# Patient Record
Sex: Female | Born: 1970
Health system: Southern US, Community
[De-identification: ages and names within clinical notes are randomized; demographics above are authoritative.]

## PROBLEM LIST (undated history)

## (undated) DIAGNOSIS — M5020 Other cervical disc displacement, unspecified cervical region: Secondary | ICD-10-CM

## (undated) DIAGNOSIS — F909 Attention-deficit hyperactivity disorder, unspecified type: Secondary | ICD-10-CM

## (undated) DIAGNOSIS — F419 Anxiety disorder, unspecified: Secondary | ICD-10-CM

## (undated) DIAGNOSIS — F29 Unspecified psychosis not due to a substance or known physiological condition: Secondary | ICD-10-CM

## (undated) DIAGNOSIS — F329 Major depressive disorder, single episode, unspecified: Secondary | ICD-10-CM

## (undated) DIAGNOSIS — E049 Nontoxic goiter, unspecified: Secondary | ICD-10-CM

## (undated) DIAGNOSIS — F429 Obsessive-compulsive disorder, unspecified: Secondary | ICD-10-CM

## (undated) DIAGNOSIS — R4689 Other symptoms and signs involving appearance and behavior: Secondary | ICD-10-CM

## (undated) DIAGNOSIS — F4310 Post-traumatic stress disorder, unspecified: Secondary | ICD-10-CM

## (undated) HISTORY — PX: ABDOMINAL HYSTERECTOMY: SHX81

## (undated) HISTORY — DX: Anxiety disorder, unspecified: F41.9

## (undated) HISTORY — DX: Unspecified psychosis not due to a substance or known physiological condition: F29

## (undated) HISTORY — DX: Other cervical disc displacement, unspecified cervical region: M50.20

## (undated) HISTORY — PX: CRYOTHERAPY: SHX1416

## (undated) HISTORY — PX: TUBAL LIGATION: SHX77

## (undated) HISTORY — PX: WISDOM TOOTH EXTRACTION: SHX21

## (undated) HISTORY — DX: Major depressive disorder, single episode, unspecified: F32.9

## (undated) HISTORY — DX: Post-traumatic stress disorder, unspecified: F43.10

## (undated) HISTORY — DX: Attention-deficit hyperactivity disorder, unspecified type: F90.9

## (undated) HISTORY — DX: Obsessive-compulsive disorder, unspecified: F42.9

## (undated) HISTORY — PX: CHOLECYSTECTOMY: SHX55

## (undated) HISTORY — DX: Nontoxic goiter, unspecified: E04.9

## (undated) HISTORY — DX: Other symptoms and signs involving appearance and behavior: R46.89

---

## 1998-12-04 ENCOUNTER — Encounter (HOSPITAL_COMMUNITY): Admission: RE | Admit: 1998-12-04 | Discharge: 1999-03-04 | Payer: Self-pay | Admitting: Psychiatry

## 2000-04-24 ENCOUNTER — Other Ambulatory Visit: Admission: RE | Admit: 2000-04-24 | Discharge: 2000-04-24 | Payer: Self-pay | Admitting: Family Medicine

## 2000-04-29 ENCOUNTER — Ambulatory Visit (HOSPITAL_COMMUNITY): Admission: RE | Admit: 2000-04-29 | Discharge: 2000-04-29 | Payer: Self-pay | Admitting: Family Medicine

## 2000-04-29 ENCOUNTER — Encounter: Payer: Self-pay | Admitting: Family Medicine

## 2001-11-18 ENCOUNTER — Ambulatory Visit (HOSPITAL_COMMUNITY): Admission: RE | Admit: 2001-11-18 | Discharge: 2001-11-18 | Payer: Self-pay | Admitting: Obstetrics and Gynecology

## 2002-03-20 ENCOUNTER — Emergency Department (HOSPITAL_COMMUNITY): Admission: EM | Admit: 2002-03-20 | Discharge: 2002-03-20 | Payer: Self-pay | Admitting: Emergency Medicine

## 2003-03-09 ENCOUNTER — Inpatient Hospital Stay (HOSPITAL_COMMUNITY): Admission: EM | Admit: 2003-03-09 | Discharge: 2003-03-21 | Payer: Self-pay | Admitting: Psychiatry

## 2003-03-22 ENCOUNTER — Other Ambulatory Visit (HOSPITAL_COMMUNITY): Admission: RE | Admit: 2003-03-22 | Discharge: 2003-04-06 | Payer: Self-pay | Admitting: Psychiatry

## 2003-04-06 ENCOUNTER — Inpatient Hospital Stay (HOSPITAL_COMMUNITY): Admission: EM | Admit: 2003-04-06 | Discharge: 2003-04-17 | Payer: Self-pay | Admitting: *Deleted

## 2003-04-18 ENCOUNTER — Other Ambulatory Visit (HOSPITAL_COMMUNITY): Admission: RE | Admit: 2003-04-18 | Discharge: 2003-04-21 | Payer: Self-pay | Admitting: *Deleted

## 2003-05-13 ENCOUNTER — Inpatient Hospital Stay (HOSPITAL_COMMUNITY): Admission: EM | Admit: 2003-05-13 | Discharge: 2003-05-16 | Payer: Self-pay | Admitting: Psychiatry

## 2003-07-09 ENCOUNTER — Emergency Department (HOSPITAL_COMMUNITY): Admission: EM | Admit: 2003-07-09 | Discharge: 2003-07-10 | Payer: Self-pay | Admitting: Emergency Medicine

## 2004-04-11 ENCOUNTER — Emergency Department (HOSPITAL_COMMUNITY): Admission: EM | Admit: 2004-04-11 | Discharge: 2004-04-12 | Payer: Self-pay | Admitting: Emergency Medicine

## 2005-01-29 ENCOUNTER — Emergency Department (HOSPITAL_COMMUNITY): Admission: EM | Admit: 2005-01-29 | Discharge: 2005-01-29 | Payer: Self-pay | Admitting: Emergency Medicine

## 2005-01-29 ENCOUNTER — Inpatient Hospital Stay (HOSPITAL_COMMUNITY): Admission: EM | Admit: 2005-01-29 | Discharge: 2005-02-05 | Payer: Self-pay | Admitting: Psychiatry

## 2005-01-29 ENCOUNTER — Ambulatory Visit: Payer: Self-pay | Admitting: Psychiatry

## 2006-01-03 ENCOUNTER — Ambulatory Visit: Payer: Self-pay | Admitting: Family Medicine

## 2006-03-28 ENCOUNTER — Ambulatory Visit: Payer: Self-pay | Admitting: Internal Medicine

## 2006-05-14 ENCOUNTER — Ambulatory Visit: Payer: Self-pay | Admitting: Family Medicine

## 2006-05-23 ENCOUNTER — Ambulatory Visit: Payer: Self-pay | Admitting: Internal Medicine

## 2006-05-29 ENCOUNTER — Ambulatory Visit: Payer: Self-pay | Admitting: Family Medicine

## 2006-10-22 ENCOUNTER — Ambulatory Visit: Payer: Self-pay | Admitting: Family Medicine

## 2006-10-29 ENCOUNTER — Encounter (INDEPENDENT_AMBULATORY_CARE_PROVIDER_SITE_OTHER): Payer: Self-pay | Admitting: Specialist

## 2006-10-29 ENCOUNTER — Ambulatory Visit (HOSPITAL_COMMUNITY): Admission: RE | Admit: 2006-10-29 | Discharge: 2006-10-30 | Payer: Self-pay | Admitting: Obstetrics & Gynecology

## 2007-01-01 ENCOUNTER — Ambulatory Visit: Payer: Self-pay | Admitting: Family Medicine

## 2007-01-01 ENCOUNTER — Encounter: Admission: RE | Admit: 2007-01-01 | Discharge: 2007-01-01 | Payer: Self-pay | Admitting: Family Medicine

## 2007-01-13 ENCOUNTER — Emergency Department (HOSPITAL_COMMUNITY): Admission: EM | Admit: 2007-01-13 | Discharge: 2007-01-14 | Payer: Self-pay | Admitting: Emergency Medicine

## 2007-02-19 ENCOUNTER — Ambulatory Visit: Payer: Self-pay | Admitting: Family Medicine

## 2007-03-04 ENCOUNTER — Ambulatory Visit: Payer: Self-pay | Admitting: Family Medicine

## 2007-06-30 ENCOUNTER — Ambulatory Visit: Payer: Self-pay | Admitting: Family Medicine

## 2007-09-01 ENCOUNTER — Ambulatory Visit: Payer: Self-pay | Admitting: Family Medicine

## 2007-09-01 DIAGNOSIS — D649 Anemia, unspecified: Secondary | ICD-10-CM

## 2007-09-01 DIAGNOSIS — F329 Major depressive disorder, single episode, unspecified: Secondary | ICD-10-CM | POA: Insufficient documentation

## 2007-09-01 DIAGNOSIS — J45909 Unspecified asthma, uncomplicated: Secondary | ICD-10-CM | POA: Insufficient documentation

## 2007-09-01 DIAGNOSIS — E049 Nontoxic goiter, unspecified: Secondary | ICD-10-CM | POA: Insufficient documentation

## 2007-09-01 DIAGNOSIS — R55 Syncope and collapse: Secondary | ICD-10-CM | POA: Insufficient documentation

## 2007-09-01 HISTORY — DX: Unspecified asthma, uncomplicated: J45.909

## 2007-09-01 HISTORY — DX: Anemia, unspecified: D64.9

## 2007-09-01 HISTORY — DX: Major depressive disorder, single episode, unspecified: F32.9

## 2007-09-02 ENCOUNTER — Telehealth: Payer: Self-pay | Admitting: Family Medicine

## 2007-09-02 LAB — CONVERTED CEMR LAB
Albumin: 3.8 g/dL (ref 3.5–5.2)
Bilirubin, Direct: 0.1 mg/dL (ref 0.0–0.3)
Chloride: 108 meq/L (ref 96–112)
Creatinine, Ser: 0.9 mg/dL (ref 0.4–1.2)
Eosinophils Absolute: 0.1 10*3/uL (ref 0.0–0.6)
Eosinophils Relative: 2.1 % (ref 0.0–5.0)
GFR calc non Af Amer: 74 mL/min
Glucose, Bld: 95 mg/dL (ref 70–99)
Lymphocytes Relative: 37.8 % (ref 12.0–46.0)
MCHC: 34.6 g/dL (ref 30.0–36.0)
MCV: 88.8 fL (ref 78.0–100.0)
Monocytes Absolute: 0.3 10*3/uL (ref 0.2–0.7)
Monocytes Relative: 8.2 % (ref 3.0–11.0)
Neutro Abs: 1.8 10*3/uL (ref 1.4–7.7)
Neutrophils Relative %: 51.9 % (ref 43.0–77.0)
Potassium: 4.5 meq/L (ref 3.5–5.1)
RBC: 4.03 M/uL (ref 3.87–5.11)
RDW: 12.1 % (ref 11.5–14.6)
Sodium: 141 meq/L (ref 135–145)
Total Protein: 6.4 g/dL (ref 6.0–8.3)
WBC: 3.6 10*3/uL — ABNORMAL LOW (ref 4.5–10.5)

## 2007-09-04 ENCOUNTER — Encounter: Payer: Self-pay | Admitting: Family Medicine

## 2007-10-05 ENCOUNTER — Encounter: Admission: RE | Admit: 2007-10-05 | Discharge: 2007-10-05 | Payer: Self-pay | Admitting: Endocrinology

## 2007-10-06 ENCOUNTER — Ambulatory Visit: Payer: Self-pay | Admitting: Family Medicine

## 2007-10-06 DIAGNOSIS — N3000 Acute cystitis without hematuria: Secondary | ICD-10-CM | POA: Insufficient documentation

## 2007-10-06 HISTORY — DX: Acute cystitis without hematuria: N30.00

## 2007-10-06 LAB — CONVERTED CEMR LAB
Bilirubin Urine: NEGATIVE
Blood in Urine, dipstick: NEGATIVE
Protein, U semiquant: NEGATIVE
Specific Gravity, Urine: >=1.03
pH: 6

## 2007-12-29 ENCOUNTER — Ambulatory Visit: Payer: Self-pay | Admitting: Family Medicine

## 2007-12-29 DIAGNOSIS — M533 Sacrococcygeal disorders, not elsewhere classified: Secondary | ICD-10-CM | POA: Insufficient documentation

## 2008-01-04 ENCOUNTER — Emergency Department (HOSPITAL_COMMUNITY): Admission: EM | Admit: 2008-01-04 | Discharge: 2008-01-04 | Payer: Self-pay | Admitting: Family Medicine

## 2008-01-14 ENCOUNTER — Emergency Department (HOSPITAL_COMMUNITY): Admission: EM | Admit: 2008-01-14 | Discharge: 2008-01-14 | Payer: Self-pay | Admitting: Family Medicine

## 2008-02-24 ENCOUNTER — Ambulatory Visit: Payer: Self-pay | Admitting: Family Medicine

## 2008-07-06 ENCOUNTER — Ambulatory Visit: Payer: Self-pay | Admitting: Family Medicine

## 2008-07-07 ENCOUNTER — Encounter: Admission: RE | Admit: 2008-07-07 | Discharge: 2008-07-07 | Payer: Self-pay | Admitting: Family Medicine

## 2008-07-15 ENCOUNTER — Encounter: Admission: RE | Admit: 2008-07-15 | Discharge: 2008-08-31 | Payer: Self-pay | Admitting: Family Medicine

## 2008-07-15 ENCOUNTER — Encounter: Payer: Self-pay | Admitting: Family Medicine

## 2008-08-11 ENCOUNTER — Encounter: Payer: Self-pay | Admitting: Family Medicine

## 2008-08-21 ENCOUNTER — Ambulatory Visit: Payer: Self-pay | Admitting: Internal Medicine

## 2008-08-21 ENCOUNTER — Observation Stay (HOSPITAL_COMMUNITY): Admission: EM | Admit: 2008-08-21 | Discharge: 2008-08-22 | Payer: Self-pay | Admitting: Family Medicine

## 2008-08-21 ENCOUNTER — Encounter: Payer: Self-pay | Admitting: Internal Medicine

## 2008-08-21 DIAGNOSIS — E0590 Thyrotoxicosis, unspecified without thyrotoxic crisis or storm: Secondary | ICD-10-CM

## 2008-08-21 DIAGNOSIS — R079 Chest pain, unspecified: Secondary | ICD-10-CM | POA: Insufficient documentation

## 2008-08-21 DIAGNOSIS — R002 Palpitations: Secondary | ICD-10-CM

## 2008-08-21 HISTORY — DX: Thyrotoxicosis, unspecified without thyrotoxic crisis or storm: E05.90

## 2008-09-08 ENCOUNTER — Ambulatory Visit: Payer: Self-pay | Admitting: Family Medicine

## 2008-09-08 DIAGNOSIS — F411 Generalized anxiety disorder: Secondary | ICD-10-CM

## 2008-09-08 HISTORY — DX: Generalized anxiety disorder: F41.1

## 2008-09-17 ENCOUNTER — Ambulatory Visit: Payer: Self-pay | Admitting: Psychiatry

## 2008-09-17 ENCOUNTER — Emergency Department (HOSPITAL_COMMUNITY): Admission: EM | Admit: 2008-09-17 | Discharge: 2008-09-17 | Payer: Self-pay | Admitting: Emergency Medicine

## 2008-09-17 ENCOUNTER — Inpatient Hospital Stay (HOSPITAL_COMMUNITY): Admission: EM | Admit: 2008-09-17 | Discharge: 2008-09-30 | Payer: Self-pay | Admitting: Psychiatry

## 2008-10-04 ENCOUNTER — Inpatient Hospital Stay (HOSPITAL_COMMUNITY): Admission: RE | Admit: 2008-10-04 | Discharge: 2008-10-15 | Payer: Self-pay | Admitting: Psychiatry

## 2009-02-08 ENCOUNTER — Ambulatory Visit: Payer: Self-pay | Admitting: Family Medicine

## 2009-02-08 DIAGNOSIS — J019 Acute sinusitis, unspecified: Secondary | ICD-10-CM

## 2009-02-08 HISTORY — DX: Acute sinusitis, unspecified: J01.90

## 2009-04-26 ENCOUNTER — Ambulatory Visit: Payer: Self-pay | Admitting: Family Medicine

## 2009-11-22 ENCOUNTER — Encounter: Admission: RE | Admit: 2009-11-22 | Discharge: 2009-11-22 | Payer: Self-pay | Admitting: Endocrinology

## 2011-05-14 NOTE — Assessment & Plan Note (Signed)
[facility]                                 ON-CALL NOTE   [name], [name]                        MRN:          [id number]  DATE:08/31/2007                            DOB:          08-14-1971    REFERRING PHYSICIAN:  Tera Mater. Clent Ridges, MD   PHONE NUMBER:  [phone]  Patient calling because she is having side effects of new medications.  She thinks she might have passed out yesterday.  Advised her to hold her  medications, see Dr. Clent Ridges tomorrow for evaluation.     Jeffrey A. Tawanna Cooler, MD  Electronically Signed    JAT/MedQ  DD: 08/31/2007  DT: 08/31/2007  Job #: [id number]

## 2011-05-14 NOTE — H&P (Signed)
NAMEGENESSA, BEMAN NO.:  [id number]   MEDICAL RECORD NO.:  [id number]          PATIENT TYPE:  IPS   LOCATION:  0302                          FACILITY:  [facility]   PHYSICIAN:  Vic Ripper, P.A.-C.DATE OF BIRTH:  [birth year]   DATE OF ADMISSION:  09/17/2008  DATE OF DISCHARGE:                       PSYCHIATRIC ADMISSION ASSESSMENT   This is a voluntary admission to the services of Dr. Geoffery Lyons.   IDENTIFYING INFORMATION:  This is a 40 year old, married, white female.  She presented here at the [facility] reporting obsessive  thoughts about suicide.  She reports that she had been doing well for  several years on her meds.  All of a sudden this past week, she began to  worry more and became more depressed.  She started thinking about  overdosing on pills last night but stopped herself.  She could not  contract for safety and hence was admitted.  Ms.  Batte was last with [place]  back in 2006, January 31st to February 05, 2005.  She had several  admissions in 2004.  She has been under the care of the [facility] [place] since 2008.   SOCIAL HISTORY:  She has a GED.  She has been married once.  She has 4  sons, one 63, twins 36, and one 35.  She is not employed.   FAMILY HISTORY:  She reports her mother was diagnosed with depression,  but she, too, was probably bipolar.   ALCOHOL AND DRUG HISTORY:  She denies.   PRIMARY CARE [place]:  Dr. Clent Ridges.  He also follows her for her  hyperthyroidism.  Jeral Pinch follows her psychiatrically.   She is currently prescribed Prozac 60 mg p.o. q.a.m., Adderall 30 mg  p.o. q.a.m., Geodon 240 mg at h.s., Klonopin 0.5 mg p.o. b.i.d. p.r.n.   She is ALLERGIC TO ROCEPHIN.   POSITIVE PHYSICAL FINDINGS:  She is a well-developed, well-nourished  female, who appears her stated age of 47.  We did recheck her TSH; it is  pending.  She reports having had it checked a few weeks ago and it was  still within normal limits.   She has no other positive findings on  review of systems today.  Her vital signs show she is 69 inches tall,  weighs 160, her temperature is 97.1, her blood pressure is 112/78 to  110/72, pulse is 83, respirations are 20.  She is status post a  hysterectomy in 2007, a cholecystectomy in 1997, two C-sections 1989 and  1990, and her thyroid condition.   MENTAL STATUS EXAM:  Today, she is alert and oriented.  She is  appropriately groomed, dressed, and nourished.  Her speech is not  pressured.  Her mood is appropriate to the situation.  She initiates  conversation.  She reports being somewhat embarrassed that she had to be  readmitted.  Thought processes are clear, rational, and goal oriented.  She specifically requests generic meds if possible.  Judgment and  insight are insight.  Concentration and memory are intact.  Intelligence  is at least average.  She reports not being  as actively suicidal since  being admitted to the hospital.  She is bipolar NOS.   DIAGNOSES:   AXIS I:  Deferred.   AXIS II:  Deferred.   AXIS III:  Hyperthyroidism.   AXIS IV:  Moderate stressors.   AXIS V:  Thirty-eight.   She has a significant history in the past for side effects to a variety  of medications.  She feels very good about the Geodon.  She feels that  this has been the best drug for her.  She states that the Prozac has  helped her stop obsessing, and in conjunction with Dr. Dub Mikes it was  decided to start Anafranil to see if we could stop her obsessive  thoughts.   ESTIMATED LENGTH OF STAY:  A week.      Vic Ripper, P.A.-C.     MD/MEDQ  D:  09/18/2008  T:  09/18/2008  Job:  [id number]
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
## 2011-05-17 NOTE — H&P (Signed)
[name], [name] NO.:  [id number]   MEDICAL RECORD NO.:  [id number]          PATIENT TYPE:  AMB   LOCATION:  SDC                           FACILITY:  [facility]   PHYSICIAN:  Ilda Mori, M.D.   DATE OF BIRTH:  06-13-1971   DATE OF ADMISSION:  DATE OF DISCHARGE:                                HISTORY & PHYSICAL   ________________ is menometrorrhagia.   This is a 40 year old gravida 4, para 3-0-1-4 (twins) who presents with the  chief complaint of menometrorrhagia. The patient has had heavy severe  bleeding for many years. She was treated in 2002 with a cryoablation with  some relief. However, over the past year, her bleeding pattern has become  heavier and more disruptive. In addition, she complains of pre- and post-  menstrual spotting which interferes with her sexual activity. The patient  has failed a cryoablation. In addition, she cannot tolerate birth control  pills.   PAST MEDICAL HISTORY:  Reveals that she has had a vaginal delivery followed  by 2 cesarean sections. The first cesarean section was done for twins and  the second cesarean section was a repeat. The patient had her gallbladder  removed in 1997. The patient's medical history is negative.   DRUG ALLERGIES:  INCLUDE ERYTHROMYCIN AND ROCEPHIN.   She is on no medications at the present time.   FAMILY HISTORY:  Positive for diabetes in an uncle and grandfather. Negative  for breast, ovarian, uterine or colon cancer.   SOCIAL HISTORY:  She is married for 22 years, has 4 children.   PHYSICAL EXAMINATION:  She is 5 feet 9 inches tall, 242 pounds. Blood  pressure 118/60.  Her ears, nose, and throat are normal.  Her neck is supple without thyromegaly.  Skin was without rashes.  Lymphatics were negative.  Her heart was regular sinus rhythm without murmurs or gallops.  Her lungs were clear.  Her abdomen was soft without hepatosplenomegaly.  Her pelvic exam revealed normal external genitalia.  Her vagina and cervix  appeared normal. Uterus was anterior and normal size. Her adnexa were  without masses.  Her breasts were without masses as well.   PREOPERATIVE LABORATORY DATA:  Revealed a sonohysterogram that was performed  on October 23 which revealed a uterus that was 8.8 cm x 5 x 6 cm. No myomas  were noted. The endometrial thickness was 0.64. There were no polyps or  submucous myoma detected.   IMPRESSION:  This is a 40 year old female who was troubled with heavy and  prolonged periods which affect her quality of life and interfere with her  sexual activity. Options were discussed with the patient. The fact that she  failed cryoablation and cannot take hormones and the fact that there was no  source of the bleeding found on sonohysterogram. Decision was made to  proceed with vaginal hysterectomy. Because of her previous 2 cesarean  sections, a laparoscopy will be performed prior to hysterectomy to ensure  that there are no adhesions from the previous surgeries which would  interfere with vaginal hysterectomy, and hopefully, a vaginal hysterectomy  will  be performed at that time. The patient was informed as well the  possibility of having to do an abdominal hysterectomy if vaginal  hysterectomy cannot be performed.      Ilda Mori, M.D.  Electronically Signed     RK/MEDQ  D:  10/28/2006  T:  10/28/2006  Job:  [id number]
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
## 2011-05-17 NOTE — Discharge Summary (Signed)
   NAME:  [name], [name]                     ACCOUNT NO.:  [id number]   MEDICAL RECORD NO.:  [id number]                   PATIENT TYPE:  IPS   LOCATION:  0507                                 FACILITY:  BH   PHYSICIAN:  Jeanice Lim, M.D.              DATE OF BIRTH:  1971-12-24   DATE OF ADMISSION:  05/13/2003  DATE OF DISCHARGE:  05/16/2003                                 DISCHARGE SUMMARY   IDENTIFYING DATA:  This is a 40 year old separated Caucasian female followed  by Dr. Kathrynn Running as an outpatient, feeling actively suicidal.   DICTATION ENDED HERE.                                               Jeanice Lim, M.D.    JEM/MEDQ  D:  06/09/2003  T:  06/09/2003  Job:  [id number]

## 2011-05-17 NOTE — Op Note (Signed)
[facility]  Patient:    [name], [name] Visit Number: [id number] MRN: [id number]          Service Type: DSU Location: [facility] Attending Physician:  Osborn Coho Dictated by:   Caralyn Guile. Arlyce Dice, M.D. Proc. Date: 11/18/01 Admit Date:  11/18/2001                             Operative Report  PREOPERATIVE DIAGNOSIS:       Menorrhagia.  POSTOPERATIVE DIAGNOSIS:      Menorrhagia.  PROCEDURE:                    Endometrial cryoablation.  SURGEON:                      Richard D. Arlyce Dice, M.D.  ANESTHESIA:                   Paracervical block with IV sedation.  ESTIMATED BLOOD LOSS:         Minimal.  FINDINGS:                     Normal-sized, anterior uterus sounding to 10 cm.  INDICATIONS:                  This is a 40 year old gravida 2, para 2 status post tubal ligation who presented to the office six weeks ago with menorrhagia, passing clots and unable to work.  D&C was performed and the patients bleeding subsided.  The following month, she presented to the office again with severely heavy menstrual period that also required her to stay home from work.  The patient was unresponsive to nonsteroidal anti-inflammatory drugs.  She is unable to take hormones because of adverse reaction to hormone medication that she has had when she tried birth control previously.  Options were discussed with the patient including hormonal shots to see if she would respond more favorably to these, Lupron and hysterectomy.  The decision was made to proceed with cryoablation as the least invasive form of permanent relief for her persistent problem.  DESCRIPTION OF PROCEDURE:     The patient was taken to the operating room and IV sedation was administered.  The patient was placed in the dorsal lithotomy position.  The vulva and perineum were prepped and draped in a sterile fashion.  Examination under anesthesia revealed a normal-sized uterus in the anterior  position.  The anterior lip of the cervix was grasped with a single-tooth tenaculum.  A total of 20 cc of 1% lidocaine was infiltrated in the paracervical tissues.  The uterus was sounded to 10 cm.  A cryoprobe was then introduced into the right cornu.  A freeze went down to -96 degrees Centigrade for six minutes.  The probe was thawed and then repositioned in the left cornu.  Another six-minute freeze at -96 degrees Centigrade was then performed.  After thawing the probe, the probe was removed and the procedure was terminated.  The patient left the operating room in good condition. Dictated by:   Caralyn Guile Arlyce Dice, M.D. Attending Physician:  Osborn Coho DD:  11/18/01 TD:  11/18/01 Job: [id number]
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
## 2011-05-17 NOTE — Discharge Summary (Signed)
NAME:  [name], [name]                     ACCOUNT NO.:  [id number]   MEDICAL RECORD NO.:  [id number]                   PATIENT TYPE:  IPS   LOCATION:  0502                                 FACILITY:  [facility]   PHYSICIAN:  Jeanice Lim, M.D.              DATE OF BIRTH:  [birth year]   DATE OF ADMISSION:  04/06/2003  DATE OF DISCHARGE:  04/17/2003                                 DISCHARGE SUMMARY   IDENTIFYING DATA:  This is a 40 year old Caucasian female, voluntarily  admitted, married, referred by outpatient psychiatrist for increasing  suicidal ideation and thought agitation over the past 2 weeks, feeling not  safe to be alone, locked self in bathroom to be safe, describing some racing  thoughts, mood swings, and obsessive ruminating on suicidal ideation,  feeling that she may not be able to control these urges.   ADMISSION MEDICATIONS:  Wellbutrin SR 100 mg daily, Effexor XR 150 mg,  Ambien, Gabitril, Klonopin.  The patient had been on Paxil and felt suicidal  and jittery and anxious, not feeling that she had responded to Effexor.   ALLERGIES:  Possibly PAXIL, ROCEPHIN, ERYTHROMYCIN.   PHYSICAL EXAMINATION:  Jittery, anxious, otherwise within normal limits,  neurologically nonfocal.   ROUTINE ADMISSION LABS:  Essentially within normal limits. >   ADMISSION DIAGNOSES:   AXIS I:  1. Major depressive disorder, recurrent, severe with suicidal ideation.  2. Rule out bipolar disorder based on history.   AXIS II:  Deferred.   AXIS III:  None.   AXIS IV:  Moderate.  Stress related to refractory illness.   AXIS V:  25/60.   HOSPITAL COURSE:  The patient was admitted and ordered routine p.r.n.  medications, underwent further monitoring, and was encouraged to participate  in individual, group and milieu therapy.  The patient was resumed on  medications.  The patient was unable to contract for safety outside the  hospital and was obsessed with dying.  The patient felt that  Effexor had not  helped and maybe worsened things.  She was tapered off of Effexor, had  failed Intensive Outpatient treatment, sent back up for inpatient  stabilization due to suicidal thoughts.  The patient described severe  depression, severe anxiety, obsessing about killing self, awakening very  anxious.  Continued to have suicidal thoughts.  She was stabilized on  medications including lithium added to stabilize mood.  Suicidal thoughts  decreased.  She described at times feeling giddy and her mood remained  somewhat labile initially and then became more stable as lithium was  optimized.  Seroquel was also optimized, along with trazodone to restore  sleep.  The patient had a history of sleeping 2-3 hours a night for several  years.  The patient was no longer visualizing committing suicide and  gradually suicidal thoughts resolved.  The patient described feeling  depressed at times but had less severe mood swings and no dangerous  ideations.  CONDITION ON DISCHARGE:  Markedly improved.  Mood was more euthymic, affect  brighter, thought process goal directed.  Thought content negative for  dangerous ideation.  The patient still appeared anxious at times, depressed  at times, and was concerned about symptoms recurring when she left the  hospital; however she was stable at the time of discharge, motivated to  follow up with the aftercare plan and be compliant with medications, as well  as use her increased support system.   DISCHARGE MEDICATIONS:  1. Lithium carbonate 300 mg 2 q.h.s.  2. Eskalith CR 450 mg q.a.m.  3. Trazodone 100 mg q.h.s. p.r.n. insomnia.  4. Klonopin 0.5 q.a.m., q.3 p.m., and 2 q.h.s.  5. Wellbutrin XL 150 mg q.a.m.  6. Ambien 10 mg q.h.s.  7. Seroquel 200 mg 2 q.h.s. and Seroquel 25 mg q.a.m. and q.3 p.m.   DISCHARGE DIAGNOSES:   AXIS I:  1. Bipolar disorder, type 2, mixed.  2. Obsessive-compulsive features.  3. Anxiety disorder.   AXIS II:   Deferred.   AXIS III:  None.   AXIS IV:  Moderate.  Stress related to refractory illness.   AXIS V:  Global assessment of function on discharge 50-55.                                                 Jeanice Lim, M.D.    Lovie Macadamia  D:  05/02/2003  T:  05/02/2003  Job:  [id number]
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
## 2011-05-17 NOTE — Op Note (Signed)
NAMENORMALEE, SISTARE NO.:  [id number]   MEDICAL RECORD NO.:  [id number]          PATIENT TYPE:  AMB   LOCATION:  SDC                           FACILITY:  [facility]   PHYSICIAN:  Ilda Mori, M.D.   DATE OF BIRTH:  1971-08-01   DATE OF PROCEDURE:  10/29/2006  DATE OF DISCHARGE:                                 OPERATIVE REPORT   PREOPERATIVE DIAGNOSIS:  Menorrhagia.   POSTOPERATIVE DIAGNOSIS:  Menorrhagia.   PROCEDURE:  Laparoscopically assisted vaginal hysterectomy.   SURGEON:  Ilda Mori, M.D.   ASSISTANT:  Carrington Clamp, M.D.   ANESTHESIA:  General endotracheal.   ESTIMATED BLOOD LOSS:  100 mL.   FINDINGS:  The tubes, ovaries and uterus appeared normal.  On laparoscopy,  the uterus was top normal size.  There were no adhesions in the pelvis due  to the previous cesarean operation.   COMPLICATIONS:  None.   INDICATIONS:  This is a 40 year old gravida 4, para 3-0-1-4, (twins) who is  admitted to the hospital for a vaginal hysterectomy.  The patient has had  many years of heavy vaginal bleeding.  She underwent cryoablation of the  endometrial tissue in 2002 with some improvement, however, her bleeding  became heavy once again. She cannot tolerate oral contraceptives and  requested definitive surgery to cure her heavy and irregular periods.   DESCRIPTION OF PROCEDURE:  The patient was brought to the operating room and  placed in the supine position where general endotracheal anesthesia was  induced.  She was then placed in the modified dorsal supine position and the  abdomen, perineum and vagina were prepped and draped in a sterile fashion.  The bladder was catheterized.  A Veress needle was introduced into the  peritoneal cavity.  Pneumoperitoneum was created.  A 5 mm port was then  placed through the base of the umbilicus and accessory instruments placed  through the suprapubic stab wound.  The pelvis was viewed and no pelvic  adhesions were  seen. At this point, the procedure went to the vaginal area.  The stirrups were repositioned for vaginal surgery and a weighted speculum  was placed in the posterior vaginal vault.  The anterior lip of the cervix  was grasped with a single tooth tenaculum and the paracervical tissues were  infiltrated with 0.5% Marcaine and 1:200,000 epinephrine.  The cervix was  circumcised and the anterior cul-de-sac was dissected but not entered.  The  posterior cul-de-sac was then entered and extended. The uterosacral  ligaments was grasped with the gyrus clamp and cautery instrument and  cauterized and cut.  The dissection was carried to the base of the cardinal  ligaments.  Attention was then turned back to the anterior cul-de-sac which  was then entered sharply.  The rest of the cardinal ligaments and the  uterine ligaments were clamped with a gyrus instrument, cauterized, and then  cut. The uterus did not descend easily and the dissection was carried out  through the base of the broad ligament.  The uterus was then wedged in the  hopes that decreasing the volume might help  a little bit.  This did, indeed,  help.  Finally, the round ligament and utero-ovarian anastomosis was  visualized, clamped with the gyrus cautery, cauterized and cut.  The  specimen was removed.  The posterior cuff was then closed with a running  interlocking Vicryl suture.  The parietal peritoneum was not closed.  The  anterior vaginal cuff was then closed with interrupted figure-of-eight  sutures.  The surgeon then regloved.  A pneumoperitoneum was recreated and  the pedicles were viewed with the laparoscope and were dry.  The procedure  was then terminated and the patient left the operating room in good  condition.      Ilda Mori, M.D.  Electronically Signed     RK/MEDQ  D:  10/29/2006  T:  10/29/2006  Job:  [id number]
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
## 2011-09-30 LAB — POCT I-STAT, CHEM 8
BUN: 12
Calcium, Ion: 1.17
Chloride: 102
Creatinine, Ser: 0.8
Glucose, Bld: 93
HCT: 38
Hemoglobin: 12.9
Potassium: 3.6
Sodium: 138
TCO2: 25

## 2011-09-30 LAB — RAPID URINE DRUG SCREEN, HOSP PERFORMED
Amphetamines: NOT DETECTED
Barbiturates: NOT DETECTED
Benzodiazepines: POSITIVE — AB
Cocaine: NOT DETECTED
Opiates: NOT DETECTED
Tetrahydrocannabinol: NOT DETECTED

## 2011-09-30 LAB — POCT PREGNANCY, URINE: Preg Test, Ur: NEGATIVE

## 2011-10-23 ENCOUNTER — Encounter: Payer: Self-pay | Admitting: Family Medicine

## 2011-10-23 ENCOUNTER — Ambulatory Visit (INDEPENDENT_AMBULATORY_CARE_PROVIDER_SITE_OTHER): Payer: BC Managed Care – PPO | Admitting: Family Medicine

## 2011-10-23 VITALS — BP 124/80 | HR 106 | Temp 98.9°F | Wt 217.0 lb

## 2011-10-23 DIAGNOSIS — M545 Low back pain, unspecified: Secondary | ICD-10-CM

## 2011-10-23 MED ORDER — PREDNISONE (PAK) 10 MG PO TABS: ORAL_TABLET | ORAL | Status: DC

## 2011-10-23 MED ORDER — CYCLOBENZAPRINE HCL 10 MG PO TABS: 10.0000 mg | ORAL_TABLET | Freq: Three times a day (TID) | ORAL | Status: AC | PRN

## 2011-10-23 NOTE — Progress Notes (Signed)
  Subjective:    Patient ID: [name], female    DOB: 1971-09-06, 40 y.o.   MRN: [id number]  HPI Here for the onset of sharp lower back pains yesterday morning when she got out of bed. The day before she had been clearing brush from her yard after a tree fell over. The pain does not radiate down the legs, no numbness or wekaness. Using heat and Motrin.    Review of Systems  Constitutional: Negative.   Musculoskeletal: Positive for back pain.       Objective:   Physical Exam  Constitutional: She appears well-developed and well-nourished.  Musculoskeletal:       Tender on the lower back with spasm. ROM is good, SLR are negative           Assessment & Plan:  Try Prednisone and Flexeril. Recheck prn

## 2011-12-13 ENCOUNTER — Ambulatory Visit (INDEPENDENT_AMBULATORY_CARE_PROVIDER_SITE_OTHER): Payer: BC Managed Care – PPO | Admitting: Family Medicine

## 2011-12-13 DIAGNOSIS — Z23 Encounter for immunization: Secondary | ICD-10-CM

## 2012-09-24 ENCOUNTER — Other Ambulatory Visit (HOSPITAL_COMMUNITY): Payer: Self-pay | Admitting: Family Medicine

## 2012-09-24 DIAGNOSIS — Z1231 Encounter for screening mammogram for malignant neoplasm of breast: Secondary | ICD-10-CM

## 2012-10-05 ENCOUNTER — Ambulatory Visit (HOSPITAL_COMMUNITY)
Admission: RE | Admit: 2012-10-05 | Discharge: 2012-10-05 | Disposition: A | Discharge status: Home / Self Care | Payer: Medicare Other | Source: Ambulatory Visit | Attending: Family Medicine | Admitting: Family Medicine

## 2012-10-05 DIAGNOSIS — Z1231 Encounter for screening mammogram for malignant neoplasm of breast: Secondary | ICD-10-CM | POA: Insufficient documentation

## 2013-09-01 ENCOUNTER — Other Ambulatory Visit (HOSPITAL_COMMUNITY): Payer: Self-pay | Admitting: Family Medicine

## 2013-09-01 DIAGNOSIS — Z1231 Encounter for screening mammogram for malignant neoplasm of breast: Secondary | ICD-10-CM

## 2013-10-06 ENCOUNTER — Ambulatory Visit (HOSPITAL_COMMUNITY): Payer: Medicare Other

## 2013-10-07 ENCOUNTER — Ambulatory Visit (HOSPITAL_COMMUNITY): Payer: Medicare Other

## 2013-10-15 ENCOUNTER — Ambulatory Visit (HOSPITAL_COMMUNITY)
Admission: RE | Admit: 2013-10-15 | Discharge: 2013-10-15 | Disposition: A | Discharge status: Home / Self Care | Payer: Medicare Other | Source: Ambulatory Visit | Attending: Family Medicine | Admitting: Family Medicine

## 2013-10-15 DIAGNOSIS — Z1231 Encounter for screening mammogram for malignant neoplasm of breast: Secondary | ICD-10-CM | POA: Insufficient documentation

## 2014-11-21 DIAGNOSIS — G588 Other specified mononeuropathies: Secondary | ICD-10-CM

## 2014-11-21 HISTORY — DX: Other specified mononeuropathies: G58.8

## 2014-11-29 DIAGNOSIS — E041 Nontoxic single thyroid nodule: Secondary | ICD-10-CM | POA: Insufficient documentation

## 2014-11-29 HISTORY — DX: Nontoxic single thyroid nodule: E04.1

## 2015-01-10 DIAGNOSIS — M797 Fibromyalgia: Secondary | ICD-10-CM

## 2015-01-10 DIAGNOSIS — G8929 Other chronic pain: Secondary | ICD-10-CM | POA: Insufficient documentation

## 2015-01-10 HISTORY — DX: Fibromyalgia: M79.7

## 2015-09-11 DIAGNOSIS — M7552 Bursitis of left shoulder: Secondary | ICD-10-CM | POA: Insufficient documentation

## 2015-09-11 HISTORY — DX: Bursitis of left shoulder: M75.52

## 2016-01-09 DIAGNOSIS — M722 Plantar fascial fibromatosis: Secondary | ICD-10-CM | POA: Insufficient documentation

## 2016-01-09 HISTORY — DX: Plantar fascial fibromatosis: M72.2

## 2016-05-14 ENCOUNTER — Other Ambulatory Visit: Payer: Self-pay

## 2017-01-01 ENCOUNTER — Other Ambulatory Visit: Payer: Self-pay | Admitting: Physician Assistant

## 2017-01-01 DIAGNOSIS — Z1239 Encounter for other screening for malignant neoplasm of breast: Secondary | ICD-10-CM

## 2017-01-20 ENCOUNTER — Ambulatory Visit: Payer: Medicare Other

## 2017-01-29 ENCOUNTER — Ambulatory Visit: Payer: Self-pay

## 2017-02-24 ENCOUNTER — Ambulatory Visit: Payer: Self-pay

## 2017-03-03 ENCOUNTER — Ambulatory Visit
Admission: RE | Admit: 2017-03-03 | Discharge: 2017-03-03 | Disposition: A | Discharge status: Home / Self Care | Payer: Medicare PPO | Source: Ambulatory Visit | Attending: Physician Assistant | Admitting: Physician Assistant

## 2017-03-03 DIAGNOSIS — Z1239 Encounter for other screening for malignant neoplasm of breast: Secondary | ICD-10-CM

## 2017-04-29 DIAGNOSIS — M5020 Other cervical disc displacement, unspecified cervical region: Secondary | ICD-10-CM

## 2017-04-29 HISTORY — DX: Other cervical disc displacement, unspecified cervical region: M50.20

## 2017-05-07 DIAGNOSIS — I998 Other disorder of circulatory system: Secondary | ICD-10-CM | POA: Insufficient documentation

## 2017-05-07 DIAGNOSIS — M48062 Spinal stenosis, lumbar region with neurogenic claudication: Secondary | ICD-10-CM | POA: Insufficient documentation

## 2017-05-07 HISTORY — DX: Other disorder of circulatory system: I99.8

## 2017-05-07 HISTORY — DX: Spinal stenosis, lumbar region with neurogenic claudication: M48.062

## 2017-06-02 DIAGNOSIS — M5127 Other intervertebral disc displacement, lumbosacral region: Secondary | ICD-10-CM | POA: Insufficient documentation

## 2017-06-02 HISTORY — DX: Other intervertebral disc displacement, lumbosacral region: M51.27

## 2017-06-11 DIAGNOSIS — M62830 Muscle spasm of back: Secondary | ICD-10-CM | POA: Insufficient documentation

## 2017-08-05 DIAGNOSIS — M47816 Spondylosis without myelopathy or radiculopathy, lumbar region: Secondary | ICD-10-CM | POA: Insufficient documentation

## 2017-08-27 ENCOUNTER — Other Ambulatory Visit (HOSPITAL_COMMUNITY): Payer: Medicare PPO | Attending: Psychiatry | Admitting: Licensed Clinical Social Worker

## 2017-08-27 DIAGNOSIS — F332 Major depressive disorder, recurrent severe without psychotic features: Secondary | ICD-10-CM | POA: Diagnosis not present

## 2017-08-27 DIAGNOSIS — F411 Generalized anxiety disorder: Secondary | ICD-10-CM | POA: Diagnosis not present
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
## 2017-09-02 ENCOUNTER — Encounter (HOSPITAL_COMMUNITY): Payer: Self-pay | Admitting: Psychiatry

## 2017-09-02 ENCOUNTER — Other Ambulatory Visit (HOSPITAL_COMMUNITY): Payer: Medicare PPO | Admitting: Licensed Clinical Social Worker

## 2017-09-02 ENCOUNTER — Other Ambulatory Visit (HOSPITAL_COMMUNITY): Payer: Medicare PPO | Attending: Psychiatry | Admitting: Occupational Therapy

## 2017-09-02 DIAGNOSIS — M7989 Other specified soft tissue disorders: Secondary | ICD-10-CM | POA: Diagnosis not present

## 2017-09-02 DIAGNOSIS — F428 Other obsessive-compulsive disorder: Secondary | ICD-10-CM | POA: Insufficient documentation

## 2017-09-02 DIAGNOSIS — F411 Generalized anxiety disorder: Secondary | ICD-10-CM | POA: Insufficient documentation

## 2017-09-02 DIAGNOSIS — F331 Major depressive disorder, recurrent, moderate: Secondary | ICD-10-CM

## 2017-09-02 DIAGNOSIS — F4310 Post-traumatic stress disorder, unspecified: Secondary | ICD-10-CM | POA: Insufficient documentation

## 2017-09-02 DIAGNOSIS — F332 Major depressive disorder, recurrent severe without psychotic features: Secondary | ICD-10-CM | POA: Diagnosis not present

## 2017-09-02 DIAGNOSIS — F422 Mixed obsessional thoughts and acts: Secondary | ICD-10-CM

## 2017-09-02 MED ORDER — FLUOXETINE HCL 20 MG PO CAPS: 20.0000 mg | ORAL_CAPSULE | Freq: Every day | ORAL | 2 refills | Status: DC

## 2017-09-02 MED ORDER — ZIPRASIDONE HCL 60 MG PO CAPS: ORAL_CAPSULE | ORAL | 1 refills | Status: DC

## 2017-09-02 MED ORDER — TOPIRAMATE 100 MG PO TABS: 100.0000 mg | ORAL_TABLET | Freq: Two times a day (BID) | ORAL | 2 refills | Status: DC

## 2017-09-02 NOTE — Therapy (Addendum)
[place], [place], [address] Phone: [phone]   Fax:  [phone]  Occupational Therapy Evaluation & Treatment  Patient Details  Name: [name] MRN: [id number] Date of Birth: Apr 08, 1971 Referring Provider: Dr. Donnelly Angelica  Encounter Date: 09/02/2017      OT End of Session - 09/02/17 1249    Visit Number 1   Number of Visits 6   Date for OT Re-Evaluation 09/23/17   Authorization Type 1) [employer] Medicare 2) Generic Commercial   OT Start Time 1030   OT Stop Time 1130   OT Time Calculation (min) 60 min   Activity Tolerance Patient tolerated treatment well   Behavior During Therapy [facility] for tasks assessed/performed      Past Medical History:  Diagnosis Date  . ADHD (attention deficit hyperactivity disorder)   . Anxiety    ptsd  . Depression    with psychosis, bipolar, ect therapy  . Goiter    multinodualr  . OCD (obsessive compulsive disorder)   . Suicidal behavior     Past Surgical History:  Procedure Laterality Date  . ABDOMINAL HYSTERECTOMY    . CHOLECYSTECTOMY    . CRYOTHERAPY     cervix  . TUBAL LIGATION      There were no vitals filed for this visit.      Subjective Assessment - 09/02/17 1248    Currently in Pain? No/denies           [facility] OT Assessment - 09/02/17 1248      Assessment   Diagnosis Major Depressive Disorder   Referring Provider Dr. Donnelly Angelica   Onset Date --  Chronic     Precautions   Precautions None     Balance Screen   Has the patient fallen in the past 6 months No   Has the patient had a decrease in activity level because of a fear of falling?  No   Is the patient reluctant to leave their home because of a fear of falling?  No       OT assessment:  Diagnosis:  MDD Past medical history: GAD, OCD, multiple hospitalizations for MDD/SI Living situation:  Husband ADLs:  Independent Work:  Does not work Leisure: Education administrator,  couponing Social support: husband, 4 sons-close to children and 6 grandchildren; best friend Struggles:  depression, anxiety OT goal:  Scientist, research (medical) Causality Orientation Scale    Subscore Percentile Score  Autonomy  67  90.51  Control  41  20.11  Impersonal  50  58.92    Motivation Type  Motivation type Explanation   Autonomy-oriented The individual is clear about what he or she is doing.  There is clear connection between behavior and interest/personal goals.  Motivation is intact.  Assessment:  Patient demonstrates autonomy-oriented motivation type.  Pt has clear connection between behavior and interest/personal goals. Patient will benefit from occupational therapy intervention in order to improve time management, financial management, stress management, job readiness skills, social skills, sleep hygiene, exercise and healthy eating habits, and health management skills and other psychosocial skills needed for preparation to return to full time community living and to be a productive community member.     Plan:  Patient will participate in skilled occupational therapy sessions individually or in a group setting to improve coping skills, psychosocial skills, and emotional skills required to return to prior level of function as a productive community member. Treatment will be 1-2  times per week for 2-6 weeks.       OT Treatment Session: Time Management    S: "I need to deep clean to get rid of some anxiety."  O: Time management session completed with emphasis on skills required, effective versus ineffective time management, importance of structure, along with tips and strategies for success. Patient provided with education on the effects of time management in regards to improving physical, emotional, and social well-being including improved ability for focus, decision-making skills, success in school, work, and social commitments, as well as benefits of reduced stress  and the experience of greater success in all aspects of daily life.   A: Pt participated in time management occupational therapy treatment session this date within the [facility] program. Pt actively engaged in discussion on 10 tips for time management. Pt completed worksheets looking at her daily schedule, time wasters, and mine for time. Pt completed problem solving on how to incorporate time for herself as well as planning and working towards small goals versus large tasks that are harder to sustain. At end of session pt identified strategies for improving current time management including using a planner book, sticky notes, or google planner app on her phone.   P: Pt provided with time management strategies to implement during her daily schedule to assist in improving her balance between self-care, school, leisure, and PHP program tasks. OT will follow up with pt on success or struggles with implementation of learned strategies in 1 week.                  OT Short Term Goals - 2017/09/03 1251      OT SHORT TERM GOAL #1   Title Patient will be educated on strategies to improve psychosocial skills needed to participate fully in all daily, work, and leisure activities.   Time 3   Period Weeks   Status New   Target Date 09/23/17     OT SHORT TERM GOAL #2   Title Patient will be educated on a HEP and independent with implementation of HEP.   Time 3   Period Weeks   Status New     OT SHORT TERM GOAL #3   Title Patient will independently apply psychosocial skills and coping mechanisms to her daily activities in order to function independently.   Time 3   Period Weeks   Status New                  Plan - 03-Sep-2017 1250    Occupational performance deficits (Please refer to evaluation for details): ADL's;IADL's;Rest and Sleep;Social Participation;Leisure   Rehab Potential Good   OT Frequency 2x / week   OT Duration --  3 weeks   OT Treatment/Interventions Self-care/ADL  training;Other (comment)  coping skills training, psychosocial skills training, community reintegration   Clinical Decision Making Limited treatment options, no task modification necessary   Consulted and Agree with Plan of Care Patient      Patient will benefit from skilled therapeutic intervention in order to improve the following deficits and impairments:  Other (comment) (decreased coping skills; decreased psychosocial skills)  Visit Diagnosis: Severe episode of recurrent major depressive disorder, without psychotic features ([place])      2017-09-03 0706  OT G-codes  Functional Assessment Tool Used (Outpatient only) clinical judgement  Functional Limitation Self care  Self Care Current Status (T5573) CJ  Self Care Goal Status (U2025) CI    Problem List Patient Active Problem List   Diagnosis Date  Noted  . ACUTE SINUSITIS, UNSPECIFIED 02/08/2009  . ANXIETY DISORDER, GENERALIZED 09/08/2008  . HYPERTHYROIDISM 08/21/2008  . PALPITATIONS 08/21/2008  . CHEST PAIN 08/21/2008  . COCCYGEAL PAIN 12/29/2007  . CYSTITIS, ACUTE 10/06/2007  . GOITER NOS 09/01/2007  . ANEMIA-NOS 09/01/2007  . DEPRESSION 09/01/2007  . ASTHMA 09/01/2007  . SYNCOPE 09/01/2007   Guadelupe Sabin, OTR/L  [phone] 09/02/2017, 12:52 PM  [facility] PARTIAL HOSPITALIZATION PROGRAM [place], [place], [address] Phone: [phone]   Fax:  [phone]  Name: [name] MRN: [id number] Date of Birth: [birth year]

## 2017-09-02 NOTE — Psych (Signed)
Behavioral Health Partial Program Assessment Note  Date: 09/02/2017 Name: [name] MRN: [id number]  Chief Complaint: depression and anxiety after steroid injections for pain  Subjective:has return of depression, OCD and anxiety symptoms after injections for back pain    HPI:Ms Chargois says she had ECT for depression 11 years ago and did very well with depression, OCD and anxiety until she had steroid injections in her back for chronic disc disease.  She has always responded poorly to steroids including oral preparations.  This go round the back feels better but she has become depressed again, has increased anxiety and OCD had gotten worse.  She has always had the symptoms but could manage them.  Depression is sadness, decreased energy. Interest, decreased focus, trouble organizing thoughts, decreased appetite, poor sleep and tiredness.  The anxiety is general anxiety and a feeling people are talking about her or judging her.  The OCD is thoughts that she has done something bad such as running over some one with her car or just repetitive thoughts that she has done something bad and being unable to stop thinking the thoughts.  She feels "paranoid" in public that people are looking at her, hearing what she is saying and judging her even though she knows they are not really.  She feels guilty that she is going through all this again particularly towards her husband who has been so good to and for her and does not deserve to go through all this again.  No real stressors in her life.  Things are good with husband, finances, her children and grandchildren.   Patient is a 46 y.o. Caucasian female presents with depression and anxiety.  Patient was enrolled in partial psychiatric program on 09/02/17.  Primary complaints include: anxiety, concern about health problems, depression worse, feeling depressed, poor concentration and tearfulness.  Onset of symptoms was abrupt with gradually worsening course since  that time. Psychosocial Stressors include the following: no particular stressors.   I have reviewed the history as presented by the patient  Complaints of Pain: in her back and hips Past Psychiatric History:  Off and on therapy and med management over the years    Substance Abuse History: none Use of Alcohol: denied Use of Caffeine: other not an issue Use of over the counter: not an issue  Past Surgical History:  Procedure Laterality Date  . ABDOMINAL HYSTERECTOMY    . CHOLECYSTECTOMY    . CRYOTHERAPY     cervix  . TUBAL LIGATION      Past Medical History:  Diagnosis Date  . ADHD (attention deficit hyperactivity disorder)   . Anxiety    ptsd  . Depression    with psychosis, bipolar, ect therapy  . Goiter    multinodualr  . OCD (obsessive compulsive disorder)   . Suicidal behavior    Outpatient Encounter Prescriptions as of 09/02/2017  Medication Sig  . calcium carbonate (OS-CAL) 600 MG TABS Take 600 mg by mouth daily.    . cholecalciferol (VITAMIN D) 1000 UNITS tablet Take 1,000 Units by mouth daily.    . clonazePAM (KLONOPIN) 0.5 MG tablet Take 0.5 mg by mouth 2 (two) times daily as needed.    . fluconazole (DIFLUCAN) 150 MG tablet Take 150 mg by mouth once.    Marland Kitchen FLUoxetine (PROZAC) 20 MG capsule Take 1 capsule (20 mg total) by mouth daily.  Marland Kitchen FOLIC ACID PO Take by mouth daily. Take 2 mg   . HYDROcodone-homatropine (HYCODAN) 5-1.5 MG/5ML syrup Take 5  mLs by mouth every 6 (six) hours as needed.    . Multiple Vitamins-Minerals (MULTIVITAMIN & MINERAL PO) Take by mouth.    . predniSONE (STERAPRED UNI-PAK) 10 MG tablet As directed for 12 days  . topiramate (TOPAMAX) 100 MG tablet Take 1 tablet (100 mg total) by mouth 2 (two) times daily.  . ziprasidone (GEODON) 60 MG capsule Take one tablet daily at bedtime  . [DISCONTINUED] DULoxetine (CYMBALTA) 60 MG capsule Take 60 mg by mouth daily.    . [DISCONTINUED] traZODone (DESYREL) 100 MG tablet Take 100 mg by mouth at bedtime.     . [DISCONTINUED] ziprasidone (GEODON) 60 MG capsule Take 60 mg by mouth. 3 caps at bedtime    No facility-administered encounter medications on file as of 09/02/2017.    Allergies  Allergen Reactions  . Ceftriaxone Sodium     REACTION: unspecified  . Erythromycin Ethylsuccinate     REACTION: unspecified  . Lorazepam     REACTION: unspecified    Social History  Substance Use Topics  . Smoking status: Never Smoker  . Smokeless tobacco: Never Used  . Alcohol use No   Functioning Relationships: good support system, good relationship with spouse or significant other, good relationship with children and good relationship with parents Education: GED: took some college courses Other Pertinent History: None Family History  Problem Relation Age of Onset  . Alcohol abuse Other   . Arthritis Other   . Diabetes Other   . Schizophrenia Father      Review of Systems Constitutional: negative Eyes: negative Ears, nose, mouth, throat, and face: negative Respiratory: negative Cardiovascular: negative Gastrointestinal: negative Genitourinary: negative Integument/breast: negative Hematologic/lymphatic: negative Musculoskeletal: back and hip pain.  Walks slowly Neurological: back pain includes nerve involvement Behavioral/Psych: depression,anxiety and OCD symptoms Endocrine: negative Allergic/Immunologic: negative  Objective:  There were no vitals filed for this visit.  Physical Exam: No exam performed today, no exam necessary.  Mental Status Exam: Appearance:  Well groomed Psychomotor::  Within Normal Limits Attention span and concentration: Normal Behavior: calm, cooperative and adequate rapport can be established Speech:  normal pitch and normal volume Mood:  anxious Affect:  normal and mood-congruent Thought Process:  Coherent and Goal Directed Thought Content:  Logical Orientation:  person, place and time/date Cognition:  grossly intact Insight:  Intact Judgment:   Intact Estimate of Intelligence: Above average Fund of knowledge: Intact Memory: Recent and remote intact Abnormal movements: None Gait and station: Normal  Assessment:  Diagnosis:     Major depression, recurrent, moderate.  Generalized anxiety disorder.  Obsessive compulsive disorder.  History of PTSD    Indications for admission: inpatient care required if not in partial hospital program  Plan: patient enrolled in Partial Hospitalization Program  Treatment options and alternatives reviewed with patient and patient understands the above plan.   Comments: Ms Odeh has already discontinued duloxetine.  Will try fluoxetine which helped in the past at 20 mg.  Will increase ziprasidone to 60 mg which helped with the OCD perseverative thoughts in the past at larger doses.  Will restart topiramate which helped in the past with depression and moodiness.  Daily group therapy with skills training. Donnelly Angelica, MD

## 2017-09-03 ENCOUNTER — Other Ambulatory Visit (HOSPITAL_COMMUNITY): Payer: Medicare PPO | Admitting: Licensed Clinical Social Worker

## 2017-09-03 DIAGNOSIS — F331 Major depressive disorder, recurrent, moderate: Secondary | ICD-10-CM

## 2017-09-03 DIAGNOSIS — F332 Major depressive disorder, recurrent severe without psychotic features: Secondary | ICD-10-CM | POA: Diagnosis not present

## 2017-09-03 DIAGNOSIS — F411 Generalized anxiety disorder: Secondary | ICD-10-CM

## 2017-09-04 ENCOUNTER — Other Ambulatory Visit (HOSPITAL_COMMUNITY): Payer: Medicare PPO | Admitting: Licensed Clinical Social Worker

## 2017-09-04 ENCOUNTER — Other Ambulatory Visit (HOSPITAL_COMMUNITY): Payer: Medicare PPO | Admitting: Specialist

## 2017-09-04 DIAGNOSIS — F411 Generalized anxiety disorder: Secondary | ICD-10-CM

## 2017-09-04 DIAGNOSIS — F332 Major depressive disorder, recurrent severe without psychotic features: Secondary | ICD-10-CM | POA: Diagnosis not present

## 2017-09-04 DIAGNOSIS — F331 Major depressive disorder, recurrent, moderate: Secondary | ICD-10-CM

## 2017-09-04 NOTE — Psych (Signed)
Note opened in error.

## 2017-09-04 NOTE — Psych (Signed)
   [facility] BH PHP THERAPIST PROGRESS NOTE  [name] [id number]  Session Time: 9 - 2   Participation Level: Active  Behavioral Response: CasualAlertAnxious and Depressed  Type of Therapy: Group Therapy , Psychotherapy, Spiritual care, Activity therapy   Treatment Goals addressed: Coping  Interventions: CBT, DBT, Supportive and Reframing  Summary:  9:00 - 10:30 Clinician led check-in regarding current stressors and situation, and review of patient completed daily inventory. Clinician utilized active listening and empathetic response and validated patient emotions. Clinician facilitated processing group on pertinent issues.  10:30 -12:00 Spiritual care group  12:00 - 12:45 Reflection group: Patients encouraged to practice skills and interpersonal techniques or work on mindfulness and relaxation techniques. The importance of self-care and making skills part of a routine to increase usage were stressed.  12:45 - 1:50 Relaxation group: Cln [name] led yoga group focused on retraining the body's response to stress.  1:50 - 2:00 Clinician led check-out. Clinician assessed for immediate needs, medication compliance and efficacy, and safety concerns.    Suicidal/Homicidal: Nowithout intent/plan  Therapist Response: [name] is a 46 y/o female presenting with depression and anxiety symptoms. Patient arrived within time allowed and reports she is feeling "fair." Patient rates her mood at a 5 on a 1- 10 scale with 10 being great. Pt reports she was able to complete what she had planned yesterday and slept well. Pt shares that she is finding support from her pets. Patient engaged in activity and discussion. Patient demonstrates some progress as evidenced by stating ways she can utilize skills for her anxiety. Patient denies SI/HI/self-harm thoughts at end of group.   Plan: Pt will decrease depression and anxiety symptoms while increasing ability to manage symptoms.    Diagnosis: Depression, major, recurrent, moderate ([place]) [F33.1]    1. Depression, major, recurrent, moderate ([place])   2. Generalized anxiety disorder       [name], LCSW 09/04/2017

## 2017-09-04 NOTE — Therapy (Addendum)
[facility] [place], [place], [address] Phone: [phone]   Fax:  [phone]  Occupational Therapy Treatment  Patient Details  Name: [name] MRN: [id number] Date of Birth: 1971/11/24 Referring Provider: Dr. Donnelly Angelica  Encounter Date: 09/04/2017      OT End of Session - 09/04/17 2139    Visit Number 2   Number of Visits 6   Date for OT Re-Evaluation 09/23/17   Authorization Type 1) [employer] 2) Generic Commercial   OT Start Time 1030   OT Stop Time 1130   OT Time Calculation (min) 60 min      Past Medical History:  Diagnosis Date  . ADHD (attention deficit hyperactivity disorder)   . Anxiety    ptsd  . Depression    with psychosis, bipolar, ect therapy  . Goiter    multinodualr  . OCD (obsessive compulsive disorder)   . Suicidal behavior     Past Surgical History:  Procedure Laterality Date  . ABDOMINAL HYSTERECTOMY    . CHOLECYSTECTOMY    . CRYOTHERAPY     cervix  . TUBAL LIGATION      There were no vitals filed for this visit.      Subjective Assessment - 09/04/17 2138    Currently in Pain? No/denies      OT assessment Diagnosis MDD Past medical history see above Living situation with husband , family supportive ADLs independent Work n/a Leisure walking  Social support family Struggles coping OT goal learn coping skills  General Causality Orientation Scale   Subscore Percentile Score  Autonomy 61 74.50  Control 29 2.97  Impersonal 48 53.26   Motivation Type  Motivation type Explanation  o Autonomy-oriented The individual is clear about what he or she is doing.  There is clear connection between behavior and interest/personal goals.  Motivation is intact.  o    o    o    Assessment:  Patient demonstrates autonomy oriented motivation type.  Patient will benefit from occupational therapy intervention in order to improve time management, financial  management, stress management, job readiness skills, social skills,sleep hygiene, exercise and healthy eating habits,  and health management skills and other psychosocial skills needed for preparation to return to full time community living and to be a productive community member.   Plan:  Patient will participate in skilled occupational therapy sessions individually or in a group setting to improve coping skills, psychosocial skills, and emotional skills required to return to prior level of function as a productive community member. Treatment will be 1-2 times per week for 2-6 weeks.          S:  Work is stressful.  I think I want to try the 5, 4, 3, 2, 1. O:  Patient participated in skilled OT life skills group focusing on stress management.  Patient was educated on and discussed causes of stress, healthy vs unhealthy stress, behaviors, symptoms, feelings associated with stress.  Patient was educated on relaxation techniques and emotional regulation techniques to cope with stress, as well as calming activities associated with each of their 5 senses.   A:  Patient was engaged throughout group. Paitent able to identify new coping strategies for stress, such as fact checking and breathing.   P:  continue participation in skilled OT group 2 times per week.  Next group will focus on healthy sleep hygiene.  OT Education - 09/04/17 2139    Education provided Yes   Education Details stress coping skills, relaxation, emotional regulation, 5 senses use of coping   Person(s) Educated Patient   Methods Explanation;Handout   Comprehension Verbalized understanding          OT Short Term Goals - 09/04/17 2140      OT SHORT TERM GOAL #1   Title Patient will be educated on strategies to improve psychosocial skills needed to participate fully in all daily, work, and leisure activities.   Time 3   Period Weeks   Status On-going     OT SHORT TERM GOAL #2    Title Patient will be educated on a HEP and independent with implementation of HEP.   Time 3   Period Weeks   Status On-going     OT SHORT TERM GOAL #3   Title Patient will independently apply psychosocial skills and coping mechanisms to her daily activities in order to function independently.   Time 3   Period Weeks   Status On-going                Patient will benefit from skilled therapeutic intervention in order to improve the following deficits and impairments:   (decreased coping skills, decreased psychosocial skills)  Visit Diagnosis: Depression, major, recurrent, moderate ([place])    Problem List Patient Active Problem List   Diagnosis Date Noted  . ACUTE SINUSITIS, UNSPECIFIED 02/08/2009  . ANXIETY DISORDER, GENERALIZED 09/08/2008  . HYPERTHYROIDISM 08/21/2008  . PALPITATIONS 08/21/2008  . CHEST PAIN 08/21/2008  . COCCYGEAL PAIN 12/29/2007  . CYSTITIS, ACUTE 10/06/2007  . GOITER NOS 09/01/2007  . ANEMIA-NOS 09/01/2007  . DEPRESSION 09/01/2007  . ASTHMA 09/01/2007  . SYNCOPE 09/01/2007    Vangie Bicker, Tony, OTR/L [phone]  09/04/2017, 9:42 PM  [facility] PARTIAL HOSPITALIZATION PROGRAM [place], [place], [address] Phone: [phone]   Fax:  [phone]  Name: [name] MRN: [id number] Date of Birth: August 04, 1971

## 2017-09-04 NOTE — Psych (Signed)
   [facility] BH PHP THERAPIST PROGRESS NOTE  SOPHEAP BASIC [id number]  Session Time: 9-2  Participation Level: Active  Behavioral Response: CasualAlertAnxious and Depressed  Type of Therapy: Group Therapy, Psychotherapy, Psychoeducation, Occupational Therapy  Treatment Goals addressed: Coping  Interventions: CBT, DBT, Supportive and Reframing  Summary: 9:00 - 10:30 Clinician led check-in regarding current stressors and situation, and review of patient completed daily inventory. Clinician utilized active listening and empathetic response and validated patient emotions. Clinician facilitated processing group on pertinent issues.  10:30 -11:30: OT Group  11:30-12:15: Clinician led psychoeducation group on distress tolerance. Self Soothe skill was introduced and patients discussed how to utilize it. Patients identified when this technique may be helpful in their personal lives.  12:15 - 1:00: Reflection group: Patients encouraged to practice skills and interpersonal techniques or work on mindfulness and relaxation techniques. The importance of self-care and making skills part of a routine to increase usage were stressed.  1:00 - 1:50 Clinician continued topic of "Distress Tolerance". Group discussed "ACCEPTS" and how/when patients can employ this method to help. Patients identified when this technique may be helpful in their personal lives.  1:50 - 2:00 Clinician led check-out. Clinician assessed for immediate needs, medication compliance and efficacy, and safety concerns.    Suicidal/Homicidal: Nowithout intent/plan  Therapist Response: BETUL BRISKY is a 46 y.o. female who presents with depression and anxiety symptoms. Patient arrived within time allowed and reports she is feeling "anxious." Patient rates her mood at a 3 on a 1- 10 scale with 10 being great. Pt reports she had a good weekend, but did not do much.  Pt reports she was able to drive to have lunch with her husband, which has been  difficult for her lately.  Pt shared her troubles with steroid injections and feeling as if the doctors did not pay attention to her when she told them she does not respond well to steroids.  Pt discussed that she felt like she was being assertive for once, and feels it failed.  Pt reports she needs to work on being assertive, instead of passive, in more areas of her life.  Patient engaged in activity and discussion. Pt responded well to self-soothe and states she will start to use some of the activities to help her.  Patient demonstrates some progress as evidenced by actively participating in group on her first day. Patient denies SI/HI/self-harm thoughts at end of session.  Plan: Patient will continue in PHP and medication management while working on decreasing depression symptoms and increasing distress tolerance skills.  Diagnosis: Depression, major, recurrent, moderate ([place]) [F33.1]    1. Depression, major, recurrent, moderate ([place])   2. Generalized anxiety disorder   3. Mixed obsessional thoughts and acts     [employer], LPCA 09/04/2017

## 2017-09-05 ENCOUNTER — Encounter (HOSPITAL_COMMUNITY): Payer: Self-pay

## 2017-09-05 ENCOUNTER — Other Ambulatory Visit (HOSPITAL_COMMUNITY): Payer: Medicare PPO | Admitting: Licensed Clinical Social Worker

## 2017-09-05 VITALS — BP 108/70 | HR 82 | Ht 68.0 in | Wt 248.0 lb

## 2017-09-05 DIAGNOSIS — F900 Attention-deficit hyperactivity disorder, predominantly inattentive type: Secondary | ICD-10-CM

## 2017-09-05 DIAGNOSIS — F332 Major depressive disorder, recurrent severe without psychotic features: Secondary | ICD-10-CM | POA: Diagnosis not present

## 2017-09-05 DIAGNOSIS — F988 Other specified behavioral and emotional disorders with onset usually occurring in childhood and adolescence: Secondary | ICD-10-CM | POA: Insufficient documentation

## 2017-09-05 DIAGNOSIS — F411 Generalized anxiety disorder: Secondary | ICD-10-CM

## 2017-09-05 HISTORY — DX: Attention-deficit hyperactivity disorder, predominantly inattentive type: F90.0

## 2017-09-05 MED ORDER — FLUOXETINE HCL 10 MG PO CAPS: ORAL_CAPSULE | ORAL | 0 refills | Status: DC

## 2017-09-05 MED ORDER — AMPHETAMINE-DEXTROAMPHETAMINE 30 MG PO TABS: 30.0000 mg | ORAL_TABLET | Freq: Every day | ORAL | 0 refills | Status: DC

## 2017-09-05 NOTE — Psych (Signed)
   [facility] BH PHP THERAPIST PROGRESS NOTE  [name] [id number]  Session Time: 9 - 2   Participation Level: Active  Behavioral Response: CasualAlertAnxious and Depressed  Type of Therapy: Group Therapy , Psychotherapy, Psychoeducation, Occupational Therapy  Treatment Goals addressed: Coping  Interventions: CBT, DBT, Supportive and Reframing  Summary:  9:00 - 10:30 Clinician led check-in regarding current stressors and situation, and review of patient completed daily inventory. Clinician utilized active listening and empathetic response and validated patient emotions. Clinician facilitated processing group on pertinent issues.  10:30 -11:30: OT Group  11:30-12:00: Clinician led group discussion on fear.  12:00 - 12:45 Reflection group: Patients encouraged to practice skills and interpersonal techniques or work on mindfulness and relaxation techniques. The importance of self-care and making skills part of a routine to increase usage were stressed.  12:45 - 1:50 Clinician introduced topic of "Cognitive Distortions". Patients identified cognitive distortions they often have and ways to combat these distortions.  1:50 - 2:00 Clinician led check-out. Clinician assessed for immediate needs, medication compliance and efficacy, and safety concerns.    Suicidal/Homicidal: Nowithout intent/plan  Therapist Response: [name] "[name]" Westerman is a 46 y/o female presenting with depression and anxiety symptoms. Patient arrived within time allowed and reports she is feeling "anxious". Patient rates her mood at a 4 on a 1-10 scale with 10 being great. Patient reports she had a stressful night after receiving a message from her doctor about confirming an appointment to get more shots. Patient stated she cried a lot but managed her emotions by going on a walk with her best friend. Patient engaged in activity and discussion. Patient demonstrates some progress as evidenced by stating ways she has and will  continue to utilize skills for her anxiety and depression. Patient denies SI/HI/self-harm thoughts at the end of group.   Plan: Pt will decrease depression and anxiety symptoms while increasing ability to manage symptoms.   Diagnosis: Severe episode of recurrent major depressive disorder, without psychotic features ([place]) [F33.2]    1. Severe episode of recurrent major depressive disorder, without psychotic features ([place])   2. Generalized anxiety disorder    Royetta Crochet, LPCA 09/05/2017

## 2017-09-05 NOTE — Progress Notes (Signed)
Progress note      Restarted Adderall 30 mg daily as she clearly needs it to focus and organize.  The 20 mg of fluoxetine activates her so will drop dose to 10 mg for a week and then resume 20 mg.

## 2017-09-05 NOTE — Psych (Signed)
   [facility] BH PHP THERAPIST PROGRESS NOTE  [name] [id number]  Session Time: 9 - 1   Participation Level: Active  Behavioral Response: CasualAlertAnxious and Depressed  Type of Therapy: Group Therapy , Psychotherapy, Psychoeducation  Treatment Goals addressed: Coping  Interventions: CBT, DBT, Supportive and Reframing  Summary:  9:00 - 10:00 Clinician led check-in regarding current stressors and situation, and review of patient completed daily inventory. Clinician utilized active listening and empathetic response and validated patient emotions. Clinician facilitated processing group on pertinent issues.  10:00 -11:00: Clinician continued discussion on cognitive distortions from yesterday. Cln educated ways to reframe unhealthy thought patterns utilizing skills of check the facts, and the best friend test.  11:00 - 12:00: Clinician led psychoeducation group on emotion, logic, and wise mind and dialectical thinking. Clinician utilized pt examples to make relevant to pt's lives.  12:00 - 12:50 Clinician introduced topic of fear related to goals. Group watched "Why You Should Define Your Fears Instead of Your Goals" Ted-Talk. Patients discussed fear setting and how to utilize it as a skill.  12:50 - 1:00 Clinician led check-out. Clinician assessed for immediate needs, medication compliance and efficacy, and safety concerns.     Suicidal/Homicidal: Nowithout intent/plan  Therapist Response: [name] "[name]" Lavey is a 46 y/o female presenting with depression and anxiety symptoms. Patient arrived within time allowed and reports she is feeling "very anxious". Patient rates her mood at a 2 on a scale of 1-10 wit 10 being great. Patient reports she cried for four hours again last night and did not get a lot of sleep. Patient also stated that she has poor concentration and energy levels. Patient engaged in activity and discussion. Patient demonstrates some progress as evidenced by taking notes  and stating ways she can utilize skills for her anxiety. Patient also shared she likes receiving handouts on the skills so that she can refer back to them when she needs to. Patient denies SI/HI/ and self-harm thoughts at the end of group   Plan: Pt will decrease depression and anxiety symptoms while increasing ability to manage symptoms.   Diagnosis: Severe episode of recurrent major depressive disorder, without psychotic features ([place]) [F33.2]    1. Severe episode of recurrent major depressive disorder, without psychotic features ([place])   2. ADHD, predominantly inattentive type   3. Generalized anxiety disorder    [name], LCSW 09/05/2017

## 2017-09-05 NOTE — Addendum Note (Signed)
Addended by: Vangie Bicker on: 09/05/2017 03:23 PM   Modules accepted: Orders

## 2017-09-05 NOTE — Progress Notes (Signed)
Met with patient as she presented with flat affect, depressed and anxious mood and admitted she was not having a good day this date.  Patient continuously shook her legs during time together but admitted she is often anxious as her norm and denied any current suicidal or homicidal ideations with no history of auditory or visual hallucinations.  Patient discussed her history of always being somewhat anxious from elementary to high school and most of her life and then becoming so depressed she had ECT approximately 10 years previously with suicidal thoughts prior to treatments.  Patient states she did better after ECT treatments but then developed focus problems so used Adderall 30 mg twice a day at once and then went back down to 30 mg once a day because she was having sleep difficulty at night with twice a day.  Patient will restart Adderall 30 mg, one a day per Dr. Tanna Furry restart order this date and patients report of extreme focus problems currently. Patient has cut back on Prozac to 10 mg per Dr. Tanna Furry request as well and states inability to even focus on grading her depression at this time but reported she was having no suicidal or homicidal ideations but that her anxiety "is a 12" on a scale of 0-10 with 0 being none and 10 the worst.  Patient stated PHP was helpful and using distracting and other skills learned to help calm self with increased anxiety.  Patient reported ongoing daily chronic pain for two herniated discs and the start of this bought with increased depression and anxiety following procedure for a spinal infusion that her provider gave her more prednisone than should have been administered due to history.  Patient reports she has a "good family' with stable and supportive members.  Patient denies any thoughts of wanting to harm self or others with no plan or intent at this time and states topamax has helped some with back pain recently. Patient to start Adderall and to keep this nurse  informed if any side effects to medication or other symptoms.  Patient reported plan to remain in PHP and to continue until is discharged due to feeling groups and medication management helping.

## 2017-09-05 NOTE — Psych (Signed)
Individual 9:00-9:45  Pt presented this morning as tearful and depressed. Pt states she is struggling because she can't concentrate on anything and it is become worse and more frustrating. Pt reports she has cried for 4 hours for the pervious 2 evenings due to frustration and sadness. Pt had trouble staying on one topic, making it necessary for the clinician to ask for clarification numerous times. Pt states this depression feels different than previous depressions, though she admits her memory about the previous depressions is "foggy". Pt reports she feels like the Prozac is making her jittery. This was discussed with the doctor and her dose was halved. Pt reports she does not want to hurt herself because "I have too much to live for." Pt reports no acts of self-harm or HI, either.  Navdeep Halt, LPCA

## 2017-09-08 ENCOUNTER — Other Ambulatory Visit (HOSPITAL_COMMUNITY): Payer: Medicare PPO | Admitting: Licensed Clinical Social Worker

## 2017-09-08 DIAGNOSIS — F332 Major depressive disorder, recurrent severe without psychotic features: Secondary | ICD-10-CM | POA: Diagnosis not present

## 2017-09-08 NOTE — Progress Notes (Signed)
Progress note     [name] complains of stammering which sounds like dystonia when she describes it as trouble talking as if her throat muscles are tight.  She has trouble getting to sleep since starting Adderral 30 mg daily but did sleep okay last night.  She is having trouble remembering and trying to keep her thoughts organized to talk smoothly.  She wonders if she should have just had another round of ECT since that worked so well last time.        Plan:  Decrease topirimate to 50 mg bid to help with thinking process and memory               Decrease ziprasidone to 40 mg hs to eliminate the apparent dystonia.  Neither one of [place] want to add another med to deal with side effects                Add trazodone 50 to 100 mg hs for sleep               Continue Adderall 30 mg daily

## 2017-09-09 ENCOUNTER — Other Ambulatory Visit (HOSPITAL_COMMUNITY): Payer: Medicare PPO | Admitting: Licensed Clinical Social Worker

## 2017-09-09 ENCOUNTER — Other Ambulatory Visit (HOSPITAL_COMMUNITY): Payer: Medicare PPO | Admitting: Occupational Therapy

## 2017-09-09 DIAGNOSIS — F332 Major depressive disorder, recurrent severe without psychotic features: Secondary | ICD-10-CM | POA: Diagnosis not present
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
## 2017-09-09 NOTE — Therapy (Signed)
[address] [place], [place], [address] Phone: [phone]   Fax:  [phone]  Occupational Therapy Treatment  Patient Details  Name: [name] MRN: [id number] Date of Birth: January 25, 1971 Referring Provider: Dr. Donnelly Angelica  Encounter Date: 09/09/2017      OT End of Session - 09/09/17 1205    Visit Number 3   Number of Visits 6   Date for OT Re-Evaluation 09/23/17   Authorization Type 1) [employer] 2) Generic Commercial   OT Start Time 1030   OT Stop Time 1110   OT Time Calculation (min) 40 min      Past Medical History:  Diagnosis Date  . ADHD (attention deficit hyperactivity disorder)   . Anxiety    ptsd  . Depression    with psychosis, bipolar, ect therapy  . Goiter    multinodualr  . Herniated disc, cervical 04/2017   Has 2 herniated discs  . OCD (obsessive compulsive disorder)   . Suicidal behavior     Past Surgical History:  Procedure Laterality Date  . ABDOMINAL HYSTERECTOMY    . CHOLECYSTECTOMY    . CRYOTHERAPY     cervix  . TUBAL LIGATION      There were no vitals filed for this visit.      Subjective Assessment - 09/09/17 1205    Currently in Pain? No/denies            [facility] OT Assessment - 09/09/17 1205      Assessment   Diagnosis Major Depressive Disorder     Precautions   Precautions None         OT Treatment Group: Sleep Hygiene   S:  "I have trouble falling asleep and staying asleep."  O:  Patient actively participated in the following skilled occupational therapy treatment session this date: Sleep hygiene - Began session with sleep hygiene questionnaire. Pt completed questionnaire and participated in discussion. Group then played Sleep Hygiene Taboo game, pt actively participated and engaged in game. Discussed importance of a routine leading up to bedtime, and educated on factors influencing sleep. Patient marginally engaged during session. Pt  participated in group discussion of factors that interfere with sleep and lifestyle factors promoting sleep. Discussed importance of exercise for the body's metabolism and promoting healthy sleep, as well as foods that can help or hinder sleep.  A:  Patient participated in skilled occupational therapy group for sleep hygiene skills this date.  Patient was marginally engaged and appears open to strategies introduced; was emotional at times during session. Provided pt with sleep prep routine outline to complete.  P:  Continue participation in skilled occupational therapy groups  1-2 times per week in order to gain the necessary skills needed to return to full time community living and learn effective coping strategies to be a productive community resident. Next session: physical and mental health and wellness.             OT Short Term Goals - 09/04/17 2140      OT SHORT TERM GOAL #1   Title Patient will be educated on strategies to improve psychosocial skills needed to participate fully in all daily, work, and leisure activities.   Time 3   Period Weeks   Status On-going     OT SHORT TERM GOAL #2   Title Patient will be educated on a HEP and independent with implementation of HEP.   Time 3   Period Weeks  Status On-going     OT SHORT TERM GOAL #3   Title Patient will independently apply psychosocial skills and coping mechanisms to her daily activities in order to function independently.   Time 3   Period Weeks   Status On-going                  Plan - 09/09/17 1205    Rehab Potential Good   OT Frequency 2x / week   OT Duration --  3 weeks   OT Treatment/Interventions Self-care/ADL training;Other (comment)  coping skills training, psychosocial skills training, community reintegration   Consulted and Agree with Plan of Care Patient      Patient will benefit from skilled therapeutic intervention in order to improve the following deficits and impairments:  Other  (comment) (decreased coping skills; decreased psychosocial skills)  Visit Diagnosis: Severe recurrent major depression without psychotic features [facility])    Problem List Patient Active Problem List   Diagnosis Date Noted  . ADHD, predominantly inattentive type 09/05/2017  . ACUTE SINUSITIS, UNSPECIFIED 02/08/2009  . ANXIETY DISORDER, GENERALIZED 09/08/2008  . HYPERTHYROIDISM 08/21/2008  . PALPITATIONS 08/21/2008  . CHEST PAIN 08/21/2008  . COCCYGEAL PAIN 12/29/2007  . CYSTITIS, ACUTE 10/06/2007  . GOITER NOS 09/01/2007  . ANEMIA-NOS 09/01/2007  . DEPRESSION 09/01/2007  . ASTHMA 09/01/2007  . SYNCOPE 09/01/2007   Guadelupe Sabin, OTR/L  [phone] 09/09/2017, 12:05 PM  [facility] PARTIAL HOSPITALIZATION PROGRAM [address] [place], [place], [address] Phone: [phone]   Fax:  [phone]  Name: [name] MRN: [id number] Date of Birth: [birth year]

## 2017-09-10 ENCOUNTER — Other Ambulatory Visit (HOSPITAL_COMMUNITY): Payer: Medicare PPO | Admitting: Licensed Clinical Social Worker

## 2017-09-10 DIAGNOSIS — F332 Major depressive disorder, recurrent severe without psychotic features: Secondary | ICD-10-CM | POA: Diagnosis not present

## 2017-09-10 MED ORDER — METHYLPHENIDATE HCL 20 MG PO TABS: 20.0000 mg | ORAL_TABLET | Freq: Two times a day (BID) | ORAL | 0 refills | Status: DC

## 2017-09-10 NOTE — Progress Notes (Signed)
Progress note  Did not take the Adderall today and had trouble focusing.  Anxiety maybe some better.  Will try the methylphenidate tablets because it is affordable and might be easier on the anxiety and the sleep.  [name] did sleep better last night.  Still depressed and anxious,  Her husband is very supportive.  Plan discontinue the Adderall for now and try the methylphenidate 20 mg tabs bid

## 2017-09-11 ENCOUNTER — Other Ambulatory Visit (HOSPITAL_COMMUNITY): Payer: Medicare PPO | Admitting: Licensed Clinical Social Worker

## 2017-09-11 ENCOUNTER — Other Ambulatory Visit (HOSPITAL_COMMUNITY): Payer: Medicare PPO | Admitting: Specialist

## 2017-09-11 DIAGNOSIS — F332 Major depressive disorder, recurrent severe without psychotic features: Secondary | ICD-10-CM

## 2017-09-11 NOTE — Psych (Signed)
   [name] PHP THERAPIST PROGRESS NOTE  [name] [id number]  Session Time: 9 - 2   Participation Level: Active  Behavioral Response: CasualAlertAnxious and Depressed  Type of Therapy: Group Therapy , Psychotherapy, Psychoeducation, Spiritual care, Activity therapy  Treatment Goals addressed: Coping  Interventions: CBT, DBT, Supportive and Reframing  Summary:  9:00 - 10:30 Clinician led check-in regarding current stressors and situation, and review of patient completed daily inventory. Clinician utilized active listening and empathetic response and validated patient emotions. Clinician facilitated processing group on pertinent issues.  10:30 -12:00 Spiritual care group  12:00 - 12:45 Reflection group: Patients encouraged to practice skills and interpersonal techniques or work on mindfulness and relaxation techniques. The importance of self-care and making skills part of a routine to increase usage were stressed.  12:45 - 1:50 Relaxation group: Cln [name] led yoga group focused on retraining the body's response to stress.  1:50 - 2:00 Clinician led check-out. Clinician assessed for immediate needs, medication compliance and efficacy, and safety concerns.    Suicidal/Homicidal: Nowithout intent/plan  Therapist Response: [name] is a 46 y/o female presenting with depression and anxiety symptoms. Patient arrived within time allowed and reports she is feeling "medium, not good but not horrible and sleepy." Patient rates her mood at a 5 on a 1-10 scale with 10 being great. Patient reports she was very sleepy due to only getting 1 hour of sleep the night before. Patient shared that she used some of her skills learned in group to help with anxiety and depression she was experiencing after leaving group yesterday. Patient engaged in activity and discussion. Patient demonstrates some progress as evidenced by stating a current way she managed she stay away from negative  thoughts. Patient shared that she wrote down her thoughts in a journal and shared them with her husband. Patient stated she and her husband was able to process the thoughts together afterwards. Patient denies SI/HI/self-harm thoughts at the end of group.    Plan: Pt will decrease depression and anxiety symptoms while increasing ability to manage symptoms.   Diagnosis: Severe recurrent major depression without psychotic features ([place]) [F33.2]    1. Severe recurrent major depression without psychotic features [facility])    [name], LCSW 09/11/2017

## 2017-09-11 NOTE — Psych (Signed)
   [facility] BH PHP THERAPIST PROGRESS NOTE  [name] [id number]  Session Time: 9 - 2   Participation Level: Active  Behavioral Response: CasualAlertAnxious and Depressed  Type of Therapy: Group Therapy , Psychotherapy, Psychoeducation, Medication Group  Treatment Goals addressed: Coping  Interventions: CBT, DBT, Supportive and Reframing  Summary:  9:00 - 10:30: Pharmacist discussed medication with group and answered questions.  10:30 -12:00: Clinician led check-in regarding current stressors and situation, and review of patient completed daily inventory. Clinician utilized active listening and empathetic response and validated patient emotions. Clinician facilitated processing group on pertinent issues.  12:00 - 12:45 Reflection group: Patients encouraged to practice skills and interpersonal techniques or work on mindfulness and relaxation techniques. The importance of self-care and making skills part of a routine to increase usage were stressed.  12:45 - 1:50 Clinician introduced topic of communication. Clinician introduced 4 communication styles: passive, aggressive, passive-aggressive, and assertive and group members discussed which style they have. Clinician educated on non-verbal aspect of communication.  1:50 - 2:00 Clinician led check-out. Clinician assessed for immediate needs, medication compliance and efficacy, and safety concerns.    Suicidal/Homicidal: Nowithout intent/plan  Therapist Response: [name] "[name]" Kulzer is a 46 y/o female presenting with depression and anxiety symptoms. Patient arrived within time allowed and reports she is feeling "anxious". Patient rates her mood at a 5 on a 1-10 scale with 10 being great. Patient shares she had a "yucky" Friday and that the rest of the weekend was improved by seeing all of her grandchildren and most of her children. Pt reports difficulty sleeping Friday and Saturday night however slept 6 hours last night. Patient engaged in  activity and discussion. Pt identified her communication style as "passive with adults and assertive with children." Patient demonstrates some progress as evidenced by reporting some improvement in her depression symptoms and reporting no prolonged crying spells over the weekend. Patient denies SI/HI/self-harm thoughts at the end of group.   Plan: Pt will decrease depression and anxiety symptoms while increasing ability to manage symptoms.   Diagnosis: Severe recurrent major depression without psychotic features ([place]) [F33.2]    1. Severe recurrent major depression without psychotic features [facility])    [name], LCSW 09/11/2017

## 2017-09-11 NOTE — Progress Notes (Signed)
GROUP NOTE - spiritual care group 09/10/2017 10:30 - 12:00 ?Facilitated by Simone Curia, MDiv    Group focused on topic of strength. ?Group members reflected on what thoughts and feelings emerge when they hear this topic. ?They then engaged in therapeutic art activity. ?Reflected on The topic of strength and represented what strength had been to them in their lives (images and patterns given) and what they saw as helpful in their life now. ?What they needed / wanted. ??Engaged in facilitated sharing of insights from activity. Activity drew on narrative framework  [name] was present throughout group.  Was not talkative during first part of group discussion.  Engaged in activity.  Became increasingly bright and talkative as she shared her activity and received feedback from group.  [name] shared the portion of her activity that related what she wants strength to be now - describing closeness and peace for her family. Though she completed the other part of the activity, she did not remark on images of strength that she was given as a child except to say they were coming back up in her life and she "thought she had dealt with them" In describing her family, she Spoke with group about her feelings of depression and described feelings of guilt / shame around feeling depressed - not wanting to burden her spouse with these feelings.  Group acknowledged and normalized her feelings around both shame and continuing to have to re-author old narratives that we were given.  Group affirmed [name]'s care for her grandchildren and noticed the brightening in [name]'s affect as she spoke about them.     [facility] / [place], MDiv

## 2017-09-11 NOTE — Psych (Signed)
   [facility] BH PHP THERAPIST PROGRESS NOTE  KARL KNARR [id number]  Session Time: 9 - 2   Participation Level: Active  Behavioral Response: CasualAlertAnxious and Depressed  Type of Therapy: Group Therapy , Psychotherapy, Psychoeducation, OT  Treatment Goals addressed: Coping  Interventions: CBT, DBT, Supportive and Reframing  Summary:  9:00 - 10:30 Clinician led check-in regarding current stressors and situation, and review of patient completed daily inventory. Clinician utilized active listening and empathetic response and validated patient emotions. Clinician facilitated processing group on pertinent issues.  10:30 -11:30: OT Group  11:30 - 12:00 Clinician continued topic of communication. Clinician introduced "I Statements" and patients practiced responding to situations using I Statements.  12:00 - 12:45 Reflection group: Patients encouraged to practice skills and interpersonal techniques or work on mindfulness and relaxation techniques. The importance of self-care and making skills part of a routine to increase usage were stressed.  12:45 - 1:50 Cln provided psychoeducation on Active Listening and how it can aid in communication. Group reviewed "fair fighting rules" and how to make communication effective during conflicts.  1:50 - 2:00 Clinician led check-out. Clinician assessed for immediate needs, medication compliance and efficacy, and safety concerns.    Suicidal/Homicidal: Nowithout intent/plan  Therapist Response: [name] is a 46 y/o female presenting with depression and anxiety [name]. Patient arrived within time allowed and reports she is feeling "anxious". Patient rates her mood at a 5 on a 1-10 scale with 10 being great. Patient shares she did not sleep well again due to racing thoughts and states only getting 1.5 hours. Pt reports worry about "nothing" and becomes tearful frequently in group. Patient engaged in activity and discussion. Patient demonstrates  some progress as evidenced by attempting to reframe and listing three positive things that happened today. Patient denies SI/HI/self-harm thoughts at the end of group.   Plan: Pt will decrease depression and anxiety [name] while increasing ability to manage [name].   Diagnosis: Severe recurrent major depression without psychotic features ([place]) [F33.2]    1. Severe recurrent major depression without psychotic features [facility])    [name], LCSW 09/11/2017

## 2017-09-11 NOTE — Therapy (Signed)
[address] [place], [place], [address] Phone: [phone]   Fax:  [phone]  Occupational Therapy Treatment  Patient Details  Name: [name] MRN: [id number] Date of Birth: 1971/06/04 Referring Provider: Dr. Donnelly Angelica  Encounter Date: 09/11/2017      OT End of Session - 09/11/17 1554    Visit Number 4   Number of Visits 6   Date for OT Re-Evaluation 09/23/17   Authorization Type 1) [employer] Medicare 2) Generic Commercial   OT Start Time 1030   OT Stop Time 1135   OT Time Calculation (min) 65 min   Activity Tolerance Patient tolerated treatment well   Behavior During Therapy [facility] for tasks assessed/performed      Past Medical History:  Diagnosis Date  . ADHD (attention deficit hyperactivity disorder)   . Anxiety    ptsd  . Depression    with psychosis, bipolar, ect therapy  . Goiter    multinodualr  . Herniated disc, cervical 04/2017   Has 2 herniated discs  . OCD (obsessive compulsive disorder)   . Suicidal behavior     Past Surgical History:  Procedure Laterality Date  . ABDOMINAL HYSTERECTOMY    . CHOLECYSTECTOMY    . CRYOTHERAPY     cervix  . TUBAL LIGATION      There were no vitals filed for this visit.      Subjective Assessment - 09/11/17 1542    Currently in Pain? No/denies        S:  I am good at cleaning and caring for my grand kids O:  patient participated in skilled OT group focusing on the relationship between an individual's mental and physical health.  Patient was educated and discussed the definitions of health, well-being, mental health, depression.  Patient was educated and discussed the importance of emotional wellbeing.  Correlation between physical and mental health was discussed.  Patient educated on strategies to implement to improve both physical and mental health:  healthy diet, sufficient sleep, exercise, setting realistic goals, having support  system.   A:  Patient was engaged throughout session.  Patient able to identify several areas of life that she is good at, and felt it was easier to identify areas that she excelled in versus struggled with.   P:  Continue skilled OT group 2 times per week for 3 weeks.  Next session follow up on what strategy was implemented to improve physical and mental health.  Group focus will be communication skills and active listening.                        OT Education - 09/11/17 1542    Education provided Yes   Education Details correllation between physical and mental health   Person(s) Educated Patient   Methods Explanation;Handout   Comprehension Verbalized understanding          OT Short Term Goals - 09/04/17 2140      OT SHORT TERM GOAL #1   Title Patient will be educated on strategies to improve psychosocial skills needed to participate fully in all daily, work, and leisure activities.   Time 3   Period Weeks   Status On-going     OT SHORT TERM GOAL #2   Title Patient will be educated on a HEP and independent with implementation of HEP.   Time 3   Period Weeks   Status On-going     OT  SHORT TERM GOAL #3   Title Patient will independently apply psychosocial skills and coping mechanisms to her daily activities in order to function independently.   Time 3   Period Weeks   Status On-going                  Plan - 09/11/17 1554    Rehab Potential Good      Patient will benefit from skilled therapeutic intervention in order to improve the following deficits and impairments:  Other (comment) (decrased coping skills, decreased psychosocial skills)  Visit Diagnosis: Severe recurrent major depression without psychotic features [facility])    Problem List Patient Active Problem List   Diagnosis Date Noted  . ADHD, predominantly inattentive type 09/05/2017  . ACUTE SINUSITIS, UNSPECIFIED 02/08/2009  . ANXIETY DISORDER, GENERALIZED 09/08/2008  .  HYPERTHYROIDISM 08/21/2008  . PALPITATIONS 08/21/2008  . CHEST PAIN 08/21/2008  . COCCYGEAL PAIN 12/29/2007  . CYSTITIS, ACUTE 10/06/2007  . GOITER NOS 09/01/2007  . ANEMIA-NOS 09/01/2007  . DEPRESSION 09/01/2007  . ASTHMA 09/01/2007  . SYNCOPE 09/01/2007    Vangie Bicker, [place], OTR/L [phone]  09/11/2017, 3:56 PM  [place], [place], [address] Phone: [phone]   Fax:  [phone]  Name: HYACINTH MARCELLI MRN: [id number] Date of Birth: 1971-09-12

## 2017-09-12 ENCOUNTER — Other Ambulatory Visit (HOSPITAL_COMMUNITY): Payer: Medicare PPO | Admitting: Licensed Clinical Social Worker

## 2017-09-12 ENCOUNTER — Encounter (HOSPITAL_COMMUNITY): Payer: Self-pay

## 2017-09-12 VITALS — BP 126/86 | HR 83 | Ht 68.0 in | Wt 248.0 lb

## 2017-09-12 DIAGNOSIS — F332 Major depressive disorder, recurrent severe without psychotic features: Secondary | ICD-10-CM

## 2017-09-12 DIAGNOSIS — F411 Generalized anxiety disorder: Secondary | ICD-10-CM

## 2017-09-12 NOTE — Psych (Signed)
   [facility] BH PHP THERAPIST PROGRESS NOTE  [name] [id number]  Session Time: 9 - 2   Participation Level: Active  Behavioral Response: CasualAlertAnxious and Depressed  Type of Therapy: Group Therapy , Psychotherapy, Psychoeducation, OT   Treatment Goals addressed: Coping  Interventions: CBT, DBT, Supportive and Reframing  Summary:  9:00 - 10:30 Clinician led check-in regarding current stressors and situation, and review of patient completed daily inventory. Clinician utilized active listening and empathetic response and validated patient emotions. Clinician facilitated processing group on pertinent issues.  10:30 -11:30: OT Group  11:30 - 12:15 Clinician led psychotherapy group on "Eleven beliefs that make life better."  12:15 -1:00: Reflection group: Patients encouraged to practice skills and interpersonal techniques or work on mindfulness and relaxation techniques. The importance of self-care and making skills part of a routine to increase usage were stressed.  1:00 - 1:50 Clinician discussed topic of self esteem and barriers to promoting self worth. Cln utilized checking the facts, best friend test, positive self mantras, and discussion of trust foundations. Group discussed how childhood, past experiences, and poor relationships effects feelings of self worth.  1:50 - 2:00 Clinician led check-out. Clinician assessed for immediate needs, medication compliance and efficacy, and safety concerns.    Suicidal/Homicidal: Nowithout intent/plan  Therapist Response: [name] is a 46 y/o female presenting with depression and anxiety symptoms. Patient arrived within time allowed and reports she is feeling "a little better than yesterday." Patient rates her mood at a 6 on a 1-10 scale with 10 being great. Patient reports that she was able to complete all her task she planned to do for the afternoon. Patient shared she picked up her medicines, helped her husband out, played with  her animals, and took a nap. Patient stated that her new medicine does not make her feel as jittery which she is thankful for. Patient engaged in activity and discussion. Patient demonstrates some progress as evidenced by stating she feels like she wants to keep moving forward and is finding the coping skills learned in group to be beneficial. Patient denies SI/HI/self-harm thoughts at the end of group.    Plan: Pt will decrease depression and anxiety symptoms while increasing ability to manage symptoms.   Diagnosis: Severe recurrent major depression without psychotic features ([place]) [F33.2]    1. Severe recurrent major depression without psychotic features [facility])    [name], LCSW 09/12/2017

## 2017-09-12 NOTE — Progress Notes (Signed)
Met with patient who presented with flat affect, anxious mood but reported some decrease in depression and doing better with recent changes in medications.  Patient reported depression at a 5, anxiety at a 6, and hopelessness at a 4 on a scale of 0-10 with 0 being none and 10 the worst she could experience.  Patient reported the change to Ritalin was helping with concentration as the Adderall she was taking was causing head pain and that she just did not feel good taking it.  Patient also reported changing the times of her Topamax and decrease in other medications were helping her thoughts as she reports less racing and ruminating ideations.  Patient denied any current suicidal or homicidal ideations and while she still scored a 21 on her PHQ9, the same score she had from 09/02/17 she reports drastic improvements in thinking, clarity and sleep with less anxiety over the past 2 days.  Patient now sleeping 6-8 hours a night sound and going out some with her husband and friend into public.  Patient reported still not driving on her own yet but plans to try this soon. Patient stable today with no complaints and states using PHP skills when in the community and when home and getting more anxious to help calm self and to keep positive thoughts.  Patient to contact this nurse if any worsening of symptoms or problems with medication.

## 2017-09-12 NOTE — Psych (Addendum)
Individual Session 11:15-12:00  Pt reports she is starting to see improvements in symptoms.  Pt reports she is learning a lot from group.  Pt states she feels less jittery, not as foggy, and feels the concentration issue is getting better.  Pt reports she had the best sleep she has had in a long time last night.  Pt reports she left group yesterday finally feeling like her smile was real and not forced, as well as not feeling hopeless.  Pt states she and her husband had a great night of spending time together.  Pt reports "My husband and I were just able to communicate.  It wasn't only about me and how I can get better and how he can help me.  It was just communication."  Pt reports one of her granddaughters will spend the night with her tonight and she is a little anxious about it due to the promise of going to [place] tomorrow for the granddaughter's birthday.  Pt reports she is still struggling to be in public "because people make me anxious."  Pt reports her paranoia is getting better though.  Pt reports she is trying hard not to let the "little things" get to her, and she is trying to focus on the moment instead of being anxious about the future. Cln and pt discussed the [employer] as an option to help patient continue with group settings after discharge from [facility].  Pt feels good about the option and wants to review the brochure over the weekend.  Pt reports she still wants to go to individual counseling once discharged from [facility].  Pt denies any SI/HI at end of session.  Amaranta Mehl, LPCA

## 2017-09-12 NOTE — Psych (Signed)
   [facility] BH PHP THERAPIST PROGRESS NOTE  [name] [id number]  Session Time: 9 - 1   Participation Level: Active  Behavioral Response: CasualAlertAnxious and Depressed  Type of Therapy: Group Therapy , Psychotherapy, Psychoeducation   Treatment Goals addressed: Coping  Interventions: CBT, DBT, Supportive and Reframing  Summary:  9:00 - 10:30: Clinician led check-in regarding current stressors and situation, and review of patient completed daily inventory. Clinician utilized active listening and empathetic response and validated patient emotions. Clinician facilitated processing group on pertinent issues.  10:30 -12:00: Clinician introduced topic of "Feelings and Emotions". Patients discussed how hard emotions and feelings can be to identify, accept, and handle.  12:00-12:50: Clinician introduced topic of "Emotional Regulation". Group discussed emotional regulation skills such as PLEASE and pleasant events. Patients identified ways they could use these skills in everyday life.  12:50 - 1:00: Clinician led check-out. Clinician assessed for immediate needs, medication compliance and efficacy, and safety concerns.    Suicidal/Homicidal: Nowithout intent/plan  Therapist Response: [name] is a 46 y/o female presenting with depression and anxiety symptoms. Patient arrived within time allowed and reports feeling "better." Patients rates her mood at a 7 on a 1-10 scale with 10 being great. Patient reports she sees herself improving. Patient stated that she used to feel hopeless but can now see the light in things. Patient shared that she slept well (8 hours), the best she has slept in a long time. Patient engaged in activity and discussion. Patient demonstrates some progress as evidenced by stating ways she processes with her husband at night to help manage her emotions she is feeling. Patient denies SI/HI/self-harm at the end of group   Plan: Pt will decrease depression and  anxiety symptoms while increasing ability to manage symptoms.   Diagnosis: Severe recurrent major depression without psychotic features ([place]) [F33.2]    1. Severe recurrent major depression without psychotic features ([place])   2. Generalized anxiety disorder    [name], LCSW 09/12/2017

## 2017-09-15 ENCOUNTER — Other Ambulatory Visit (HOSPITAL_COMMUNITY): Payer: Medicare PPO | Admitting: Licensed Clinical Social Worker

## 2017-09-15 DIAGNOSIS — F411 Generalized anxiety disorder: Secondary | ICD-10-CM

## 2017-09-15 DIAGNOSIS — F332 Major depressive disorder, recurrent severe without psychotic features: Secondary | ICD-10-CM

## 2017-09-16 ENCOUNTER — Other Ambulatory Visit (HOSPITAL_COMMUNITY): Payer: Medicare PPO | Admitting: Licensed Clinical Social Worker

## 2017-09-16 ENCOUNTER — Other Ambulatory Visit (HOSPITAL_COMMUNITY): Payer: Medicare PPO | Admitting: Occupational Therapy

## 2017-09-16 ENCOUNTER — Encounter (HOSPITAL_COMMUNITY): Payer: Self-pay | Admitting: Occupational Therapy

## 2017-09-16 DIAGNOSIS — F411 Generalized anxiety disorder: Secondary | ICD-10-CM

## 2017-09-16 DIAGNOSIS — F332 Major depressive disorder, recurrent severe without psychotic features: Secondary | ICD-10-CM

## 2017-09-16 NOTE — Psych (Signed)
   [facility] BH PHP THERAPIST PROGRESS NOTE  [name] [id number]  Session Time: 9 - 2   Participation Level: Active  Behavioral Response: CasualAlertAnxious and Depressed  Type of Therapy: Group Therapy , Psychotherapy, Psychoeducation, Pharmacist Group   Treatment Goals addressed: Coping  Interventions: CBT, DBT, Supportive and Reframing  Summary:  9:00 - 10:30 Pharmacist discussed medication with group and answered questions.  10:30 - 12:00 Clinician led check-in regarding current stressors and situation, and review of patient completed daily inventory. Clinician utilized active listening and empathetic response and validated patient emotions. Clinician facilitated processing group on pertinent issues.  12:00 - 12:45 Reflection group: Patients encouraged to practice skills and interpersonal techniques or work on mindfulness and relaxation techniques. The importance of self-care and making skills part of a routine to increase usage when stressed.  12:45 - 1:50 Clinician introduced "Mindfulness". Clinician showed [employer] on mindfulness and the group discussed. Group discussed "What" and "How" skills of mindfulness. Patients identified what types of activities would help them practice mindfulness. Patients took part in a body scan activity and other activities and discussed which would work best for them.  1:50 - 2:00 Clinician led check-out. Clinician assessed for immediate needs, medication compliance and efficacy, and safety concerns.   Suicidal/Homicidal: Nowithout intent/plan  Therapist Response: [name] "[name]" Spengler is a 46 y/o female presenting with depression and anxiety symptoms. Patient arrived within time allowed and reports feeling "good." Patient rates her mood at a 7.5 on a 1-10 scale with 10 being great. Patient reports she had a good weekend.  Pt reports she feels the new med change is really working for her.  Pt reports she cried over the weekend, "but they were happy  tears for the first time in a long time." Pt reports she used distraction to decrease anxiety over the weekend.  Patient engaged in activity and discussion. Pt identified word searches as a way she wants to try practicing mindfulness.  Patient demonstrates some progress as evidenced by increased use of skills. Patient denies SI/HI/self-harm at the end of group  Plan: Pt will decrease depression and anxiety symptoms while increasing ability to manage symptoms.   Diagnosis: Severe recurrent major depression without psychotic features ([place]) [F33.2]    1. Severe recurrent major depression without psychotic features ([place])   2. Generalized anxiety disorder    Royetta Crochet, LPCA 09/16/2017

## 2017-09-16 NOTE — Therapy (Signed)
[place], [place], [address] Phone: [phone]   Fax:  [phone]  Occupational Therapy Treatment  Patient Details  Name: [name] MRN: [id number] Date of Birth: July 16, 1971 Referring Provider: Dr. Donnelly Angelica  Encounter Date: 09/16/2017      OT End of Session - 09/16/17 1219    Visit Number 5   Number of Visits 6   Date for OT Re-Evaluation 09/23/17   Authorization Type 1) [employer] Medicare 2) Generic Commercial   OT Start Time 1030   OT Stop Time 1130   OT Time Calculation (min) 60 min   Activity Tolerance Patient tolerated treatment well   Behavior During Therapy [facility] for tasks assessed/performed      Past Medical History:  Diagnosis Date  . ADHD (attention deficit hyperactivity disorder)   . Anxiety    ptsd  . Depression    with psychosis, bipolar, ect therapy  . Goiter    multinodualr  . Herniated disc, cervical 04/2017   Has 2 herniated discs  . OCD (obsessive compulsive disorder)   . Suicidal behavior     Past Surgical History:  Procedure Laterality Date  . ABDOMINAL HYSTERECTOMY    . CHOLECYSTECTOMY    . CRYOTHERAPY     cervix  . TUBAL LIGATION      There were no vitals filed for this visit.      Subjective Assessment - 09/16/17 1219    Currently in Pain? No/denies            [facility] OT Assessment - 09/16/17 1219      Assessment   Diagnosis Major Depressive Disorder     Precautions   Precautions None        OT Group: Communication Skills-Active Listening and Open Discussion   S:  "I might need paper to remember all the points."   O:  Patient actively participated in the following skilled occupational therapy treatment session this date:             Social and communication skills: Pt participated in group focusing on active listening and open communication via dialogue. Pt participated in small group/pair activity-participants given a subject  to discuss. One person talks about the subject for 3 minutes while the other person practices active listening; then the listener recapped what the speaker said but did not include opinions or personal thoughts. Pairs/groups then switch roles for the next subject. At end of small group activity all participants reconvene for large group discussion on a potentially controversial topic. Today's topic was social media and whether it is good or bad. Pt was actively engaged during both small and large group activities and contributed to the discussion.    A:  Patient participated in skilled occupational therapy group for social and communication skills this date.  Patient was engaged during activities and provided thoughts and feelings on session.      P:  Continue participation in skilled occupational therapy groups  1-2 times per week for 2 weeks in order to gain the necessary skills needed to return to full time community living and learn effective coping strategies to be a productive community resident.          OT Short Term Goals - 09/04/17 2140      OT SHORT TERM GOAL #1   Title Patient will be educated on strategies to improve psychosocial skills needed to participate fully in all daily, work, and leisure activities.  Time 3   Period Weeks   Status On-going     OT SHORT TERM GOAL #2   Title Patient will be educated on a HEP and independent with implementation of HEP.   Time 3   Period Weeks   Status On-going     OT SHORT TERM GOAL #3   Title Patient will independently apply psychosocial skills and coping mechanisms to her daily activities in order to function independently.   Time 3   Period Weeks   Status On-going                  Plan - 09/16/17 1220    Rehab Potential Good   OT Frequency 2x / week   OT Duration --  3 weeks   OT Treatment/Interventions Self-care/ADL training;Other (comment)  coping skills training, psychosocial skills training, community  reintegration   Consulted and Agree with Plan of Care Patient      Patient will benefit from skilled therapeutic intervention in order to improve the following deficits and impairments:  Other (comment) (decreased coping skills; decreased psychosocial skills)  Visit Diagnosis: Severe recurrent major depression without psychotic features [facility])    Problem List Patient Active Problem List   Diagnosis Date Noted  . ADHD, predominantly inattentive type 09/05/2017  . ACUTE SINUSITIS, UNSPECIFIED 02/08/2009  . ANXIETY DISORDER, GENERALIZED 09/08/2008  . HYPERTHYROIDISM 08/21/2008  . PALPITATIONS 08/21/2008  . CHEST PAIN 08/21/2008  . COCCYGEAL PAIN 12/29/2007  . CYSTITIS, ACUTE 10/06/2007  . GOITER NOS 09/01/2007  . ANEMIA-NOS 09/01/2007  . DEPRESSION 09/01/2007  . ASTHMA 09/01/2007  . SYNCOPE 09/01/2007   Guadelupe Sabin, OTR/L  [phone] 09/16/2017, 12:20 PM  [facility] PARTIAL HOSPITALIZATION PROGRAM [place], [place], [address] Phone: [phone]   Fax:  [phone]  Name: JULEY GIOVANETTI MRN: [id number] Date of Birth: [birth year]

## 2017-09-16 NOTE — Psych (Signed)
   [facility] BH PHP THERAPIST PROGRESS NOTE  [name] [id number]  Session Time: 9 - 2   Participation Level: Active  Behavioral Response: CasualAlertAnxious and Depressed  Type of Therapy: Group Therapy , Psychotherapy, Psychoeducation, Occupational Therapy   Treatment Goals addressed: Coping  Interventions: CBT, DBT, Supportive and Reframing  Summary:  9:00 -10:30: Clinician led check-in regarding current stressors and situation, and review of patient completed daily inventory. Clinician utilized active listening and empathetic response and validated patient emotions. Clinician facilitated processing group on pertinent issues.  10:30 - 11:30: OT Group  11:30 - 12:15: Clinician led group in a discussion about trust, sharing with others, and free choice about what information is shared with whom.  12:15 - 1:00: Reflection group: Patients encouraged to practice skills and interpersonal techniques or work on mindfulness and relaxation techniques. The importance of self-care and making skills part of a routine to increase usage were stressed.  1:00-1:50: Clinician introduced topic of "Values and Goals". Group discussed why values and having goals are important and why it is helpful to know what values/goals are most important to self. Group discussed why certain values are more important to self than others. Group completed a goals worksheet to help narrow down steps to working towards a goal.  1:50 - 2:00 Clinician led check-out. Clinician assessed for immediate needs, medication compliance and efficacy, and safety concerns.  Suicidal/Homicidal: Nowithout intent/plan  Therapist Response: [name] is a 46 y/o female presenting with depression and anxiety symptoms. Patient arrived within time allowed and reports feeling "OK." Patient rates her mood at a 6 on a 1-10 scale with 10 being great. Pt reports she was able to cook dinner with her husband the previous evening, which has  not happened in a while. Pt states she was able to walk the dog on her own and drive herself to group this morning despite the anxiety she was experiencing. Pt states she found a mindfulness video she likes on youtube and will continue to use it to help. Pt states she did not sleep well last night, but thinks it was due to the anxiety related to driving and her husband getting numerous calls from work.  Patient engaged in activity and discussion. Patient demonstrates some progress as evidenced by increased activities done on her own. Patient denies SI/HI/self-harm at the end of group  Plan: Pt will decrease depression and anxiety symptoms while increasing ability to manage symptoms.   Diagnosis: Severe recurrent major depression without psychotic features ([place]) [F33.2]    1. Severe recurrent major depression without psychotic features ([place])   2. Generalized anxiety disorder    Royetta Crochet, LPCA 09/16/2017

## 2017-09-17 ENCOUNTER — Other Ambulatory Visit (HOSPITAL_COMMUNITY): Payer: Medicare PPO | Admitting: Licensed Clinical Social Worker

## 2017-09-17 DIAGNOSIS — F332 Major depressive disorder, recurrent severe without psychotic features: Secondary | ICD-10-CM

## 2017-09-17 DIAGNOSIS — F411 Generalized anxiety disorder: Secondary | ICD-10-CM

## 2017-09-17 NOTE — Psych (Signed)
   [facility] BH PHP THERAPIST PROGRESS NOTE  [name] [id number]  Session Time: 9 - 2   Participation Level: Active  Behavioral Response: CasualAlertAnxious and Depressed  Type of Therapy: Group Therapy , Psychotherapy, Spiritual Care, Activity Therapy   Treatment Goals addressed: Coping  Interventions: CBT, DBT, Supportive and Reframing  Summary:  9:00 - 10:30 Clinician led check-in regarding current stressors and situation, and review of patient completed daily inventory. Clinician utilized active listening and empathetic response and validated patient emotions. Clinician facilitated processing group on pertinent issues.  10:30 -12:00 Spiritual care group  12:00 - 12:45 Reflection group: Patients encouraged to practice skills and interpersonal techniques or work on mindfulness and relaxation techniques. The importance of self-care and making skills part of a routine to increase usage were stressed.  12:45 - 1:50 Relaxation group: Cln [name] led yoga group focused on retraining the body's response to stress.  1:50 - 2:00 Clinician led check-out. Clinician assessed for immediate needs, medication compliance and efficacy, and safety concerns.    Suicidal/Homicidal: Nowithout intent/plan  Therapist Response: [name] "[name]" Knepp is a 46 y/o female presenting with depression and anxiety symptoms. Patient arrived within time allowed and reports feeling "okay." Patient rates her mood at a 8 on a 1-10 scale with 10 being great. Pt reports "I decided I'm not letting this be a bad day." Pt reports last night was "treacherous" and declined to talk about it, however presents with bright affect and mood. Pt reports she is starting physical therapy today and is utilizing prayer and thought stopping to reframe negative thoughts when they come. Patient engaged in activity and discussion. Patient demonstrates some progress as evidenced by ability to self manage negative emotions. Patient denies  SI/HI/self-harm at the end of group  Plan: Pt will decrease depression and anxiety symptoms while increasing ability to manage symptoms.   Diagnosis: Severe recurrent major depression without psychotic features ([place]) [F33.2]    1. Severe recurrent major depression without psychotic features ([place])   2. Generalized anxiety disorder    [name], LCSW 09/17/2017

## 2017-09-17 NOTE — Progress Notes (Signed)
09/17/2017 10:30-12:00.  Pt attended spirituality group facilitated by chaplain Jerene Pitch, MDiv    Spirituality group focused on topic of "hope."   Pts responded to topic and, engaged in facilitated dialog, expressing their understanding of topic and where they see this in their life.   Group facilitation drew on elements of client-centered, humanist / existential and psycho-dynamic group process.    Rim was present throughout group.  She presented with appropriate affect, was reserved in conversation, but engaged when facilitator or other group members prompted via questions.  Showed independent engagement with other group members through head nods.    Louana described her family when speaking of hope.  Resonated with another group member who spoke of positive thinking and finding silver linings.  Marland Mcalpine stated that she was able to hold on to more good thoughts now than she was when she arrived.  Though she still has moments where "things I don't want to think about" emerge, she commented on using her coping mechanisms.  Received encouragement from other group members.    WL / Ethel, MDiv

## 2017-09-18 ENCOUNTER — Other Ambulatory Visit (HOSPITAL_COMMUNITY): Payer: Medicare PPO | Admitting: Specialist

## 2017-09-18 ENCOUNTER — Other Ambulatory Visit (HOSPITAL_COMMUNITY): Payer: Self-pay | Admitting: Psychiatry

## 2017-09-18 ENCOUNTER — Other Ambulatory Visit (HOSPITAL_COMMUNITY): Payer: Medicare PPO | Admitting: Licensed Clinical Social Worker

## 2017-09-18 DIAGNOSIS — F411 Generalized anxiety disorder: Secondary | ICD-10-CM

## 2017-09-18 DIAGNOSIS — F332 Major depressive disorder, recurrent severe without psychotic features: Secondary | ICD-10-CM | POA: Diagnosis not present

## 2017-09-18 MED ORDER — ZIPRASIDONE HCL 40 MG PO CAPS: ORAL_CAPSULE | ORAL | 0 refills | Status: DC

## 2017-09-18 MED ORDER — TOPIRAMATE 50 MG PO TABS: 50.0000 mg | ORAL_TABLET | Freq: Two times a day (BID) | ORAL | 0 refills | Status: DC

## 2017-09-18 MED ORDER — TRAZODONE HCL 50 MG PO TABS: 50.0000 mg | ORAL_TABLET | Freq: Every day | ORAL | 1 refills | Status: DC
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
## 2017-09-18 NOTE — Psych (Signed)
  [facility] Partial Hospitalization Program Psych Discharge Summary  [name] [id number]  Admission date: 09/02/2017 Discharge date: 09/19/2017  Reason for admission: depression  Progress in Program Toward Treatment Goals: adequate.  Ms Leiner is more focused and less depressed.  Still quite anxious  Progress (rationale): fair progress but still not comfortable being without daily support worrying she is not stable enough not to regress and need to return  Discharge Plan: intensive outpatient Continue fluoxetine 20 mg daily. Ziprasidone 40 mg hs, topiramate 50 mg bid, methylphenidate 20 mg tablets bid, trazodone 50 mg hs and add gabapentin 300 mg tid for anxiety    Donnelly Angelica, MD 09/18/2017

## 2017-09-18 NOTE — Psych (Signed)
   [facility] BH PHP THERAPIST PROGRESS NOTE  [name] [id number]  Session Time: 9 - 2   Participation Level: Active  Behavioral Response: CasualAlertAnxious and Depressed  Type of Therapy: Group Therapy , Psychotherapy, Psychoeducation, Occupational Therapy   Treatment Goals addressed: Coping  Interventions: CBT, DBT, Supportive and Reframing  Summary:  9:00 - 10:30 Clinician led check-in regarding current stressors and situation, and review of patient completed daily inventory. Clinician utilized active listening and empathetic response and validated patient emotions. Clinician facilitated processing group on pertinent issues.  10:30 -11:30: OT Group  11:30-12:15: Clinician led psychoeducation group on distress tolerance. The STOP skill was introduced, and patients discussed how to utilize it. Patients identified when this technique may be helpful in their personal lives.  12:15 - 1:00: Reflection group: Patients encouraged to practice skills and interpersonal techniques or work on mindfulness and relaxation techniques. The importance of self-care and making skills part of a routine to increase usage were stressed.  1:00 - 1:50 Clinician continued topic of "Distress Tolerance". Group discussed "ACCEPTS" and how/when patients can employ this method to help. Patients identified when this technique may be helpful in their personal lives.  1:50 - 2:00 Clinician led check-out. Clinician assessed for immediate needs, medication compliance and efficacy, and safety concerns.    Suicidal/Homicidal: Nowithout intent/plan  Therapist Response: [name] is a 46 y/o female presenting with depression and anxiety symptoms. Patient arrived within time allowed and reports she is feeling "anxious because I was running late this morning." Patient rates her mood at a 7 on a 1-10 scale with 10 being great. Patient reports that she wanted improvement by the end of the day to be at an 8. Patient  also stated that she has been having nightmares for the past couple of nights that has limited how much sleep she has got. Patient shared that she spoke to her brother this morning. Patient stated that they had a weird conversation about their childhood but was overall glad they were able to talk. Patient also reported that she had PT last night and thoroughly enjoyed it. Patient engaged in activity and discussion. Patient demonstrates some progress as evidenced by sharing with the group activities she currently uses to down regulate when anxious. Patient denies SI/HI/self-harm at the end of group.    Plan: Pt will decrease depression and anxiety symptoms while increasing ability to manage symptoms.   Diagnosis: Severe recurrent major depression without psychotic features ([place]) [F33.2]    1. Severe recurrent major depression without psychotic features ([place])   2. Generalized anxiety disorder    [name], LCSW 09/18/2017

## 2017-09-18 NOTE — Therapy (Addendum)
[name] [place], [place], [address] Phone: [phone]   Fax:  [phone]  Occupational Therapy Treatment  Patient Details  Name: [name] MRN: [id number] Date of Birth: [birth year] Referring Provider: Dr. Donnelly Angelica  Encounter Date: 09/18/2017      OT End of Session - 09/18/17 1503    Visit Number 6   Number of Visits 6   Date for OT Re-Evaluation 09/23/17   Authorization Type 1) [employer] Medicare 2) Generic Commercial   OT Start Time 1030   OT Stop Time 1130   OT Time Calculation (min) 60 min   Activity Tolerance Patient tolerated treatment well   Behavior During Therapy [facility] for tasks assessed/performed      Past Medical History:  Diagnosis Date  . ADHD (attention deficit hyperactivity disorder)   . Anxiety    ptsd  . Depression    with psychosis, bipolar, ect therapy  . Goiter    multinodualr  . Herniated disc, cervical 04/2017   Has 2 herniated discs  . OCD (obsessive compulsive disorder)   . Suicidal behavior     Past Surgical History:  Procedure Laterality Date  . ABDOMINAL HYSTERECTOMY    . CHOLECYSTECTOMY    . CRYOTHERAPY     cervix  . TUBAL LIGATION      There were no vitals filed for this visit.      Subjective Assessment - 09/18/17 1502    Currently in Pain? No/denies        S:  I am getting better at using my coping skills, such as fact checking and deep breathing.  O:  Patient participated in stress relief group focusing on protective factors today.  Patient was educated on protective factors as they assist in building resiliency when dealing with day to day challenges.  Protective factors discussed included social support, coping skills, physical health, sense of purpose, self-esteem, and healthy thinking.  Group members rated their performance in each area, identified a factor that had benefited them in the past.  Patient also identified 2 protective factors  that they felt they could improve upon, how life would be different if they improved these areas, and identified specific actions they will take to improve this area.  Lastly, patient's drew a picture of what action they were going to take and shared with the group.   A:  [name] is committed to trashing negative thoughts and leaning on her support system more often.  P:  Continue skilled OT group 2 times per week for 3 weeks.  Group focus will be on communication skills.                        OT Education - 09/18/17 1503    Education provided Yes   Education Details Educated patient on protective factors as an aid to overcoming daily challenges   Person(s) Educated Patient   Methods Explanation;Handout   Comprehension Verbalized understanding          OT Short Term Goals - 09/04/17 2140      OT SHORT TERM GOAL #1   Title Patient will be educated on strategies to improve psychosocial skills needed to participate fully in all daily, work, and leisure activities.   Time 3   Period Weeks   Status On-going     OT SHORT TERM GOAL #2   Title Patient will be educated on a HEP and independent with implementation  of HEP.   Time 3   Period Weeks   Status On-going     OT SHORT TERM GOAL #3   Title Patient will independently apply psychosocial skills and coping mechanisms to her daily activities in order to function independently.   Time 3   Period Weeks   Status On-going                Patient will benefit from skilled therapeutic intervention in order to improve the following deficits and impairments:  Other (comment) (decreased coping skills, decreased psychosocial skills)  Visit Diagnosis: Severe recurrent major depression without psychotic features ([place])  Generalized anxiety disorder    Problem List Patient Active Problem List   Diagnosis Date Noted  . ADHD, predominantly inattentive type 09/05/2017  . ACUTE SINUSITIS, UNSPECIFIED 02/08/2009  .  ANXIETY DISORDER, GENERALIZED 09/08/2008  . HYPERTHYROIDISM 08/21/2008  . PALPITATIONS 08/21/2008  . CHEST PAIN 08/21/2008  . COCCYGEAL PAIN 12/29/2007  . CYSTITIS, ACUTE 10/06/2007  . GOITER NOS 09/01/2007  . ANEMIA-NOS 09/01/2007  . DEPRESSION 09/01/2007  . ASTHMA 09/01/2007  . SYNCOPE 09/01/2007    Vangie Bicker, [place], OTR/L [phone]  09/18/2017, 3:07 PM  [facility] PARTIAL HOSPITALIZATION PROGRAM [place], [place], [address] Phone: [phone]   Fax:  [phone]  Name: [name] MRN: [id number] Date of Birth: 1971-06-12    OCCUPATIONAL THERAPY DISCHARGE SUMMARY  Visits from Start of Care: 6  Current functional level related to goals / functional outcomes: Pt actively participated in group discussions and activities relating to coping skills in the following areas: sleep hygiene, time management, social and communication skills, physical and mental health and wellness, stress management, and financial management.    Remaining deficits: Pt continues to struggle with anxiety and coping.    Education / Equipment: Education and strategies provided for the above mentioned areas.  Plan: Patient agrees to discharge.  Patient goals were met. Patient is being discharged due to meeting the stated rehab goals.  ?????

## 2017-09-19 ENCOUNTER — Other Ambulatory Visit (HOSPITAL_COMMUNITY): Payer: Medicare PPO | Admitting: Licensed Clinical Social Worker

## 2017-09-19 VITALS — BP 124/80 | HR 77 | Ht 68.0 in | Wt 247.0 lb

## 2017-09-19 DIAGNOSIS — F332 Major depressive disorder, recurrent severe without psychotic features: Secondary | ICD-10-CM | POA: Diagnosis not present

## 2017-09-19 DIAGNOSIS — F411 Generalized anxiety disorder: Secondary | ICD-10-CM

## 2017-09-19 NOTE — Progress Notes (Signed)
Met with patient today as she presented with appropriate affect, more level mood and rated her depression a 2-3, anxiety a 7 and hopelessness a 5 on a scale of 0-10 with 0 being none and 10 the worst.  Patient reported she was having a great day today and scored her morning a 9 out of 10 being the best from 0-10.  Patient admitted she is a little apprehensive with ending PHP and plan to start IOP in the coming week but reports ready for transition.  Patient to start Gabapentin 300 mg tonight as reported she had gotten a message from her pharmacy this order was there from Dr. Lovena Le to pick up and to take 3 times a day.  Patient reported only sleeping about 5 hours night again due to recent increase in vividness of dreams but now trying to go without Trazodone and this has stopped.  Patient hopes Gabapentin will help with going to sleep and denies any other problems with current medications.  Patient definitely smiling more today and less anxious outwardly.  Patient with no complaints at present and will follow up with her in IOP if she has any issues with new Gabapentin medication once she starts it over the coming weekend.  Patient denies any suicidal or homicidal ideations and no plan or intent to harm self or others.

## 2017-09-22 ENCOUNTER — Other Ambulatory Visit (HOSPITAL_COMMUNITY): Payer: Self-pay

## 2017-09-22 ENCOUNTER — Other Ambulatory Visit (HOSPITAL_COMMUNITY): Payer: Medicare PPO | Admitting: Psychiatry

## 2017-09-22 ENCOUNTER — Encounter (HOSPITAL_COMMUNITY): Payer: Self-pay | Admitting: Psychiatry

## 2017-09-22 DIAGNOSIS — F332 Major depressive disorder, recurrent severe without psychotic features: Secondary | ICD-10-CM | POA: Diagnosis not present

## 2017-09-22 NOTE — Progress Notes (Signed)
Transition note from [facility] to IOP  [name] did well in the [facility] after developing depression and a return of anxiety and OCD symptoms following prednisone injections.  She wanted to avoid inpatient and did.  Depression and anxiety remain at lower levels but the concern was that if completely discharged she would revert to more serious depression     Transfer to IOP with daily group therapy and continue current medications        Diagnoses remain major depression recurrent moderate, generalized anxiety disorder, OCD and history of PTSD.

## 2017-09-22 NOTE — Progress Notes (Signed)
[name] is a 46 y.o., married, Caucasian female, who was transitioned from [facility].  Pt was in PHP from 08-27-17 thru 09-19-17; treatment for depression and anxiety sx's.  Hx of inpatient and IOP admits.  Had ECT in 2009, states depressive symptoms improved until recently when her back pain was treated with steroid injections.  Admits to hx of OCD and GAD, but states sx's improved also after ECT.  Pt reports increase in anxiety since the depression has returned.  Denies any SI.  CC: previous notes for more hx A:  Re-oriented pt.  Provided pt with an orientation folder.  F/U with Anselmo Rod, LCSW on 09-30-17 @ 1:30 pm and Dr. Daron Offer on 11-17-17 @ 10 a.m.  Encouraged support groups.  R:  Pt receptive.        Dellia Nims, M.Ed, CNA

## 2017-09-22 NOTE — Progress Notes (Signed)
    Daily Group Progress Note  Program: IOP  Group Time: 9:00-12:00  Participation Level: Active  Behavioral Response: Appropriate  Type of Therapy:  Group Therapy  Summary of Progress:  Pt. Presents as anxious, quiet. Pt.'s first day in IOP and introduced herself to the group. Pt. Reported that she had a very good weekend and was able to support two of her grandchildren at sporting events over the weekend by herself. Pt. Participated in medication management education group with the pharmacist. Pt. Watched video about learning to push through negative self-talk with metaphor of passengers on a bus.      Nancie Neas, LPC

## 2017-09-22 NOTE — Psych (Signed)
   [facility] BH PHP THERAPIST PROGRESS NOTE  [name] [id number]  Session Time: 9 - 1   Participation Level: Active  Behavioral Response: CasualAlertAnxious and Depressed  Type of Therapy: Group Therapy , Psychotherapy, Psychoeducation   Treatment Goals addressed: Coping  Interventions: CBT, DBT, Supportive and Reframing  Summary:  9:00 - 10:30 Clinician led check-in regarding current stressors and situation, and review of patient completed daily inventory. Clinician utilized active listening and empathetic response and validated patient emotions. Clinician facilitated processing group on pertinent issues.  10:30 -12:00: Clinician continued with topic of "Distress Tolerance". Clinician discussed "TIPS" and self-soothe, and how/when patients can employ these methods to help. Patients identified when these techniques may be helpful in their personal lives.  12:00- 12:50 Clinician continued with coping skills. Clinician provided patients with a list of coping skills. Patients created "coping skills flashcards" to use in times of need.  12:50 - 1:00 Clinician led check-out. Clinician assessed for immediate needs, medication compliance and efficacy, and safety concerns.     Suicidal/Homicidal: Nowithout intent/plan  Therapist Response: [name] is a 46 y/o female presenting with depression and anxiety symptoms. Patient arrived within time allowed and reports she is feeling "good because I had a good morning." Patient rates her mood at a 9 on a 1-10 scale with 10 being great. Patient reports that she did not want to talk about her evening but wanted to discuss her morning instead. Patient shared that she and her husband had a good morning together. They joked, had good conversation, and went to [employer] for breakfast. Patient stated that this was a huge step for her and her anxiety decreased tremendously. Patient engaged in activity and discussion. Patient demonstrated some  progress as evidenced by sharing with the group ways she is going to keep herself busy over the week to keep her mind and thoughts in a positive place. Patient denies SI/HI/self-harm thoughts at the end of group.    Plan: Pt will discharge from PHP and step down to IOP. Pt has met treatment goals of decreased anxiety and depression, improved medication stability, and increased ability to self manage moods. Pt has made progress as noted by scales, observation, and self report. Pt and psychiatrist are in alignment with discharge plan. Pt will begin IOP within this agency on Monday 09/22/17 at 9 am. Pt denies SI/HI at time of discharge.    Diagnosis: Severe recurrent major depression without psychotic features ([place]) [F33.2]    1. Severe recurrent major depression without psychotic features ([place])   2. Generalized anxiety disorder    [name], [place] 09/22/2017

## 2017-09-23 ENCOUNTER — Other Ambulatory Visit (HOSPITAL_COMMUNITY): Payer: Self-pay

## 2017-09-23 ENCOUNTER — Other Ambulatory Visit (HOSPITAL_COMMUNITY): Payer: Medicare PPO | Admitting: Psychiatry

## 2017-09-23 ENCOUNTER — Ambulatory Visit (HOSPITAL_COMMUNITY): Payer: Self-pay

## 2017-09-23 DIAGNOSIS — F332 Major depressive disorder, recurrent severe without psychotic features: Secondary | ICD-10-CM | POA: Diagnosis not present

## 2017-09-24 ENCOUNTER — Other Ambulatory Visit (HOSPITAL_COMMUNITY): Payer: Self-pay

## 2017-09-24 ENCOUNTER — Other Ambulatory Visit (HOSPITAL_COMMUNITY): Payer: Medicare PPO | Admitting: Psychiatry

## 2017-09-24 DIAGNOSIS — F332 Major depressive disorder, recurrent severe without psychotic features: Secondary | ICD-10-CM | POA: Diagnosis not present

## 2017-09-24 MED ORDER — HYDROXYZINE HCL 25 MG PO TABS: 25.0000 mg | ORAL_TABLET | Freq: Three times a day (TID) | ORAL | 0 refills | Status: DC | PRN

## 2017-09-25 ENCOUNTER — Ambulatory Visit (HOSPITAL_COMMUNITY): Payer: Self-pay

## 2017-09-25 ENCOUNTER — Other Ambulatory Visit (HOSPITAL_COMMUNITY): Payer: Medicare PPO | Admitting: Psychiatry

## 2017-09-25 ENCOUNTER — Other Ambulatory Visit (HOSPITAL_COMMUNITY): Payer: Self-pay

## 2017-09-25 DIAGNOSIS — F332 Major depressive disorder, recurrent severe without psychotic features: Secondary | ICD-10-CM | POA: Diagnosis not present

## 2017-09-25 NOTE — Progress Notes (Signed)
Ms Bari says the gabapentin has helped the anxiety considerably but has caused swelling in her feet which happened the last time she tried it.  She discontinued it.       Plan is to try hydroxyzine 25 mg tid next.  Benzodiazepines and SSRI's all had side effects for her causing her to discontinue them in the past.

## 2017-09-25 NOTE — Progress Notes (Signed)
    Daily Group Progress Note  Program: IOP  Group Time: 9:00-12:00  Participation Level: Active  Behavioral Response: Appropriate  Type of Therapy:  Group Therapy  Summary of Progress: Pt. Shared that she was having a good day. Pt. Shared her recent experiences with [facility] and that she had a significant reduction in her physical pain as a result of using it. Pt. Discussed a recent conversation with her mother and ongoing pattern of her mother being very critical of her body and her weight. Pt. Received feedback from the group about being more assertive with her mother. Pt. Received feedback from the counselor about a possible connection between her anxiety and her reluctance to have healthier communication boundaries with her mother. Pt. Expressed reluctance to have let her mother know that her comments are hurtful out of fear that her mother will be hurt. Pt. Participated in discussion facilitated by the mental health association about community resources and support groups.     Nancie Neas, LPC

## 2017-09-25 NOTE — Progress Notes (Signed)
    Daily Group Progress Note  Program: IOP  Group Time: 9:00-12:00   Participation Level: Active   Behavioral Response: Appropriate   Type of Therapy:  Group Therapy   Summary of Progress: Pt presented as engaged.  Counselor introduced and guided group through Gratitude mindfulness exercise from "Breathe" Mindfulness app.  Pt shared that confidence and social anxiety are things she would like to work on in group.  Pt said she should feel confidence at home but usually doesn't, despite a strong support system.  Pt said she married at 46 years old and has hard times with her husband but he is an important support.  Pt engaged in discussion of grief and loss with Chaplain.  Nancie Neas, LPC

## 2017-09-26 ENCOUNTER — Other Ambulatory Visit (HOSPITAL_COMMUNITY): Payer: Medicare PPO

## 2017-09-26 ENCOUNTER — Other Ambulatory Visit (HOSPITAL_COMMUNITY): Payer: Self-pay

## 2017-09-29 ENCOUNTER — Other Ambulatory Visit (HOSPITAL_COMMUNITY): Payer: Medicare PPO | Attending: Psychiatry | Admitting: Psychiatry

## 2017-09-29 DIAGNOSIS — F332 Major depressive disorder, recurrent severe without psychotic features: Secondary | ICD-10-CM | POA: Diagnosis not present

## 2017-09-29 NOTE — Progress Notes (Signed)
    Daily Group Progress Note  Program: IOP  Group Time: 9:00-12:00   Participation Level: Active   Behavioral Response: Appropriate   Type of Therapy:  Group Therapy   Summary of Progress: Pt presented as engaged and quiet.  Pt engaged in medication management discussion with pharmacist.  Pt said that it was her adult son's birthday over the weekend, which was enjoyable.  Pt said she missed group Friday because she was under the weather and her refrigerator stopped working.  Nancie Neas, LPC

## 2017-09-29 NOTE — Progress Notes (Signed)
    Daily Group Progress Note  Program: IOP  Group Time: 9:00-12:00   Participation Level: Active   Behavioral Response: Appropriate   Type of Therapy:  Group Therapy   Summary of Progress: Pt presented as engaged.  Counselor introduced and guided group through Gratitude mindfulness exercise from "Breathe" Mindfulness app.  Pt shared that confidence and social anxiety are things she would like to work on in group.  Pt said she should feel confidence at home but usually doesn't, despite a strong support system.  Pt said she married at 46 years old and has hard times with her husband but he is an important support.  Pt engaged in discussion of grief and loss with Chaplain.  Nancie Neas, LPC

## 2017-09-30 ENCOUNTER — Ambulatory Visit (HOSPITAL_COMMUNITY): Payer: Self-pay | Admitting: Licensed Clinical Social Worker

## 2017-09-30 ENCOUNTER — Other Ambulatory Visit (HOSPITAL_COMMUNITY): Payer: Medicare PPO | Admitting: Psychiatry

## 2017-09-30 DIAGNOSIS — F332 Major depressive disorder, recurrent severe without psychotic features: Secondary | ICD-10-CM | POA: Diagnosis not present

## 2017-10-01 ENCOUNTER — Other Ambulatory Visit (HOSPITAL_COMMUNITY): Payer: Medicare PPO | Admitting: Psychiatry

## 2017-10-01 DIAGNOSIS — F332 Major depressive disorder, recurrent severe without psychotic features: Secondary | ICD-10-CM

## 2017-10-01 MED ORDER — PROPRANOLOL HCL 10 MG PO TABS: 10.0000 mg | ORAL_TABLET | Freq: Three times a day (TID) | ORAL | 0 refills | Status: DC

## 2017-10-02 ENCOUNTER — Other Ambulatory Visit (HOSPITAL_COMMUNITY): Payer: Medicare PPO | Admitting: Psychiatry

## 2017-10-02 DIAGNOSIS — F332 Major depressive disorder, recurrent severe without psychotic features: Secondary | ICD-10-CM | POA: Diagnosis not present

## 2017-10-02 NOTE — Progress Notes (Signed)
    Daily Group Progress Note  Program: IOP  Group Time: 9:00-12:00   Participation Level: Active   Behavioral Response: Appropriate   Type of Therapy:  Group Therapy   Summary of Progress: Pt presented as engaged and quiet.  Pt participated in discussion of grief and loss with Chaplain, identifying loss of control as a significant loss in her life.  Pt said that she tried to give her children options as children because she never was given options as a child.  Pt said that she is used to managing everyone else's emotions, apologizing and fixing situations so that no one becomes upset or hurt.  Pt participated in guided meditation.  Nancie Neas, LPC
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
## 2017-10-02 NOTE — Progress Notes (Signed)
    Daily Group Progress Note  Program: IOP  Group Time: 9:00-12:00  Participation Level: Active  Behavioral Response: Appropriate  Type of Therapy:  Group Therapy  Summary of Progress:  Pt. Presented with brightened affect, reported significantly lowered anxiety. Pt. Attributed lowered anxiety to medication change and was very happy with the change. Pt. Provided thoughtful feedback to another pt. About her experience as a teenage parent and working through the guilt and shame of her experience. Pt. Participated in reflective reading and discussion about forgiveness of self.       Nancie Neas, LPC

## 2017-10-03 ENCOUNTER — Other Ambulatory Visit (HOSPITAL_COMMUNITY): Payer: Medicare PPO | Admitting: Psychiatry

## 2017-10-03 DIAGNOSIS — F332 Major depressive disorder, recurrent severe without psychotic features: Secondary | ICD-10-CM

## 2017-10-06 ENCOUNTER — Other Ambulatory Visit (HOSPITAL_COMMUNITY): Payer: Medicare PPO | Admitting: Licensed Clinical Social Worker

## 2017-10-06 DIAGNOSIS — F332 Major depressive disorder, recurrent severe without psychotic features: Secondary | ICD-10-CM | POA: Diagnosis not present

## 2017-10-06 DIAGNOSIS — F411 Generalized anxiety disorder: Secondary | ICD-10-CM

## 2017-10-07 ENCOUNTER — Other Ambulatory Visit (HOSPITAL_COMMUNITY): Payer: Medicare PPO | Admitting: Psychiatry

## 2017-10-07 DIAGNOSIS — F332 Major depressive disorder, recurrent severe without psychotic features: Secondary | ICD-10-CM

## 2017-10-07 NOTE — Progress Notes (Signed)
    Daily Group Progress Note  Program: IOP  Group Time: 9:00-12:00  Participation Level: Active  Behavioral Response: Appropriate  Type of Therapy:  Group Therapy  Summary of Progress: Pt. Presented as calm, engaged in the group process, smiled appropriately. Pt. Reported to the group that she was feeling "ok". Pt. Participated in discussion of importance of engaging in use of medication, change of environment, and use of coping skills. Pt. Participated in grief and loss with the Chaplain.      Nancie Neas, LPC

## 2017-10-07 NOTE — Progress Notes (Signed)
    Daily Group Progress Note  Program: IOP Group Time: 9:00-12:00   Participation Level: Active   Behavioral Response: Appropriate   Type of Therapy:  Group Therapy   Summary of Progress: Pt presented as engaged and quiet.  Pt said that she "has her moments" but is overall doing better on the new medication.  Counselors explained the practices of meditation and mindfulness and how they may be used appropriately in different contexts.  Pt watched Bren Brown's "The Power of Vulnerability" Clare Gandy Talk and engaged in discussion about it.  Nancie Neas, LPC

## 2017-10-08 ENCOUNTER — Other Ambulatory Visit (HOSPITAL_COMMUNITY): Payer: Medicare PPO | Admitting: Psychiatry

## 2017-10-08 DIAGNOSIS — F332 Major depressive disorder, recurrent severe without psychotic features: Secondary | ICD-10-CM

## 2017-10-08 MED ORDER — METHYLPHENIDATE HCL 20 MG PO TABS: 20.0000 mg | ORAL_TABLET | Freq: Two times a day (BID) | ORAL | 0 refills | Status: DC

## 2017-10-08 NOTE — Progress Notes (Signed)
    Daily Group Progress Note  Program: IOP Group Time: 9:00-12:00   Participation Level: Active   Behavioral Response: Appropriate   Type of Therapy:  Group Therapy   Summary of Progress: Pt presented as anxious in mood and affect. Pt reports she had an "okay" weekend and that she "didn't allow it to mess up her Sunday." Pt shares she had an unpleasant encounter with an in-law that upset her. Pt was able to process with group and resolve feelings about the situation. Pt engaged in medication group with pharmacist and participated in coping skill discussion. Pt denies SI/HI.   [name], LCSW

## 2017-10-09 ENCOUNTER — Other Ambulatory Visit (HOSPITAL_COMMUNITY): Payer: Medicare PPO | Admitting: Psychiatry

## 2017-10-09 DIAGNOSIS — F332 Major depressive disorder, recurrent severe without psychotic features: Secondary | ICD-10-CM

## 2017-10-09 MED ORDER — PROPRANOLOL HCL 10 MG PO TABS: ORAL_TABLET | ORAL | 0 refills | Status: DC

## 2017-10-09 MED ORDER — ZIPRASIDONE HCL 40 MG PO CAPS: ORAL_CAPSULE | ORAL | 1 refills | Status: DC

## 2017-10-09 NOTE — Progress Notes (Signed)
    Daily Group Progress Note  Program: IOP  Group Time: 9:00-12:00  Participation Level: Active  Behavioral Response: Appropriate  Type of Therapy:  Group Therapy  Summary of Progress:  Pt. Continues to work on her anxiety and her discomfort with excessive behaviors in her home. Pt. Reported to the group that it was significant that she left her home with her bed unmade and her sister-in-law was at her home. Pt. Was given affirmation and encouragement for her effort and willingness to sit with the discomfort of leaving her home with the unmade bed. Pt. Participated in discussion provided by the mental health associaton about community resources. Pt. Participated in reflective reading about "downsizing" or letting go of previous identities or adding to our current definition of self.      Nancie Neas, LPC

## 2017-10-09 NOTE — Progress Notes (Signed)
[facility] DISCHARGE NOTE  Patient:  SHATIA SINDONI DOB:  01/08/1971  Date of Admission: 09/22/2017  Date of Discharge: 10/10/2017  Reason for Admission:step down from partial hospital program with anxiety and depression  IOP Course:attended and participated.  Anxiety seems to have improved with propranolol.  Depression is much better  Mental Status at Discharge:no suicidal thinking  Diagnosis: major depression recurrent moderate, generalized anxiety disorder, OCD, PTSD  Level of Care:  IOP  Discharge destination: has appointments with her psychiatrist and therapist     Comments:  Currently doing very well. Medications are fluoxetine20 mg daily, propranolol 20 mg bid and 21m mg hs as needed, topiramate50 mg bid, ziprasidone 40 mg hs    The patient received suicide prevention pamphlet:  Yes   Donnelly Angelica, MD Patient ID: BRYLEE BERK, female   DOB: [birth year], 46 y.o.   MRN: [id number]

## 2017-10-10 ENCOUNTER — Other Ambulatory Visit (HOSPITAL_COMMUNITY): Payer: Medicare PPO | Admitting: Psychiatry

## 2017-10-10 DIAGNOSIS — F332 Major depressive disorder, recurrent severe without psychotic features: Secondary | ICD-10-CM

## 2017-10-10 NOTE — Patient Instructions (Signed)
D:  Patient successfully completed MH-IOP today.  A:  Discharge today.  Follow up with Dr. Daron Offer on 11-17-17 @ 10 a.m and Anselmo Rod, LCSW on 10-15-17 @ 8 a.m.  Encouraged support groups.  R:  Pt receptive.

## 2017-10-10 NOTE — Progress Notes (Signed)
NISHITA ISAACKS is a 46 y.o. ,married, Caucasian female, who was transitioned from [facility].  Pt was in PHP from 08-27-17 thru 09-19-17; treatment for depression and anxiety sx's.  Hx of inpatient and IOP admits.  Had ECT in 2009, states depressive symptoms improved until recently when her back pain was treated with steroid injections.  Admits to hx of OCD and GAD, but states sx's improved also after ECT.  Pt reports increase in anxiety since the depression has returned.   CC: previous notes for more hx. Pt completed MH-IOP today.  Reports feeling better overall, but is anxious re: discharging today.  Denies any SI, HI or A/V hallucinations.  Pt states she met with [facility] yesterday for orientation. A:  Discharge pt today.  Provided pt with support.  F/U with Anselmo Rod, LCSW on 10-15-17 @ 8 a.m and Dr. Daron Offer on 11-17-17 @ 10 a.m.  Encouraged support groups.  R:  Pt receptive.           Carlis Abbott, RITA, M.Ed, CNA

## 2017-10-13 ENCOUNTER — Other Ambulatory Visit (HOSPITAL_COMMUNITY): Payer: Medicare PPO

## 2017-10-13 NOTE — Progress Notes (Signed)
    Daily Group Progress Note  Program: IOP    Group Time: 9:00-12:00   Participation Level: Active   Behavioral Response: Appropriate   Type of Therapy:  Group Therapy   Summary of Progress: Pt presented as engaged.  Pt participated in pull-out session with one of the counselors.  Pt said that she had never previously considered the extent to which her childhood has had an impact on her behavior and mental health in adulthood.  Pt related to other group member about the feeling of discomfort of inviting others into her home without having a chance to clean it.  Counselors identified that pt's work may be around sitting with the discomfort of, for example, letting a dish sit in the sink without washing it, for perhaps 30 minutes and then gradually increasing the time she lets it sit.  Pt agreed that this could be a helpful practice.  Pt participated in guided meditation.  Nancie Neas, LPC

## 2017-10-13 NOTE — Progress Notes (Signed)
    Daily Group Progress Note  Program: IOP   Group Time: 9:00-12:00   Participation Level: Active   Behavioral Response: Appropriate   Type of Therapy:  Group Therapy   Summary of Progress: Pt presented as engaged.  Pt shared that when she went home the night before, she did not make her bed or think about making it, even when it had been bothering her a lot in group.  Counselors affirmed pt's progress.  Pt spoke more about how she struggles to resist cleaning her house excessively, especially when anyone is visiting.  Group discussed perfectionism and what fighting it step by step could look like.  Pt said that she is scared to leave the group because she has been in PHP and IOP collectively for a while.  Counselors and group members expressed affirmation and gratitude for pt's time in group.  Pt thanked everyone and expressed that group has been helpful for her.        Nancie Neas, LPC

## 2017-10-14 ENCOUNTER — Other Ambulatory Visit (HOSPITAL_COMMUNITY): Payer: Medicare PPO

## 2017-10-15 ENCOUNTER — Ambulatory Visit (INDEPENDENT_AMBULATORY_CARE_PROVIDER_SITE_OTHER): Payer: Medicare PPO | Admitting: Licensed Clinical Social Worker

## 2017-10-15 ENCOUNTER — Other Ambulatory Visit (HOSPITAL_COMMUNITY): Payer: Medicare PPO

## 2017-10-15 ENCOUNTER — Encounter (HOSPITAL_COMMUNITY): Payer: Self-pay | Admitting: Licensed Clinical Social Worker

## 2017-10-15 DIAGNOSIS — F331 Major depressive disorder, recurrent, moderate: Secondary | ICD-10-CM | POA: Diagnosis not present

## 2017-10-15 NOTE — Progress Notes (Signed)
   THERAPIST PROGRESS NOTE  Session Time: 8:10-9:05am  Participation Level: Active  Behavioral Response: NeatAlertEuthymic  Type of Therapy: Individual Therapy  Treatment Goals addressed: improve psychiatric symptoms, emotional regulation skills, healthy coping skills, improve unhelpful thought and belief patterns, calming skills  Interventions: CBT, Motivational Interviewing, grounding and Mindfulness techniques  Summary: [name] is a 46 y.o. female who presents with Major Depressive Disorder, recurrent, moderate without psychotic features    Suicidal/Homicidal: No without intent/plan  Therapist Response:  [name] met with clinician for an individual session. She discussed her psychiatric symptoms, her current life events, and her homework. [name] shared her progress in IOP group and with medications. [name] also identified several new coping skills that have been useful. Clinician probed about childhood, marriage, children, and relationships with her family. Clinician noted positive interactions and lots of support from family members, who care and understand [name]. Clinician discussed how negative self talk can be defeating and increase depression. Clinician also assessed for anxiety sxs due to ruminating thoughts and worry.    Plan: Return in 3-4 weeks.  Diagnosis: Axis I:  Major Depressive Disorder, recurrent, moderate without psychotic features   Mindi Curling LCSW 10/15/2017

## 2017-10-16 ENCOUNTER — Other Ambulatory Visit (HOSPITAL_COMMUNITY): Payer: Medicare PPO

## 2017-10-17 ENCOUNTER — Other Ambulatory Visit (HOSPITAL_COMMUNITY): Payer: Medicare PPO

## 2017-10-20 ENCOUNTER — Other Ambulatory Visit (HOSPITAL_COMMUNITY): Payer: Medicare PPO

## 2017-10-21 ENCOUNTER — Other Ambulatory Visit (HOSPITAL_COMMUNITY): Payer: Medicare PPO

## 2017-10-22 ENCOUNTER — Other Ambulatory Visit (HOSPITAL_COMMUNITY): Payer: Medicare PPO

## 2017-10-23 ENCOUNTER — Other Ambulatory Visit (HOSPITAL_COMMUNITY): Payer: Medicare PPO

## 2017-10-24 ENCOUNTER — Other Ambulatory Visit (HOSPITAL_COMMUNITY): Payer: Medicare PPO

## 2017-10-27 ENCOUNTER — Other Ambulatory Visit (HOSPITAL_COMMUNITY): Payer: Medicare PPO

## 2017-10-28 ENCOUNTER — Other Ambulatory Visit (HOSPITAL_COMMUNITY): Payer: Medicare PPO

## 2017-10-29 ENCOUNTER — Other Ambulatory Visit (HOSPITAL_COMMUNITY): Payer: Medicare PPO

## 2017-10-30 ENCOUNTER — Other Ambulatory Visit (HOSPITAL_COMMUNITY): Payer: Medicare PPO

## 2017-10-31 ENCOUNTER — Other Ambulatory Visit (HOSPITAL_COMMUNITY): Payer: Medicare PPO

## 2017-11-03 ENCOUNTER — Other Ambulatory Visit (HOSPITAL_COMMUNITY): Payer: Medicare PPO

## 2017-11-04 ENCOUNTER — Other Ambulatory Visit (HOSPITAL_COMMUNITY): Payer: Medicare PPO

## 2017-11-05 ENCOUNTER — Other Ambulatory Visit (HOSPITAL_COMMUNITY): Payer: Medicare PPO

## 2017-11-06 ENCOUNTER — Ambulatory Visit (HOSPITAL_COMMUNITY): Payer: Self-pay | Admitting: Licensed Clinical Social Worker

## 2017-11-06 ENCOUNTER — Other Ambulatory Visit (HOSPITAL_COMMUNITY): Payer: Medicare PPO

## 2017-11-07 ENCOUNTER — Other Ambulatory Visit (HOSPITAL_COMMUNITY): Payer: Medicare PPO

## 2017-11-10 ENCOUNTER — Other Ambulatory Visit (HOSPITAL_COMMUNITY): Payer: Medicare PPO

## 2017-11-11 ENCOUNTER — Other Ambulatory Visit (HOSPITAL_COMMUNITY): Payer: Medicare PPO

## 2017-11-12 ENCOUNTER — Other Ambulatory Visit (HOSPITAL_COMMUNITY): Payer: Medicare PPO

## 2017-11-13 ENCOUNTER — Other Ambulatory Visit (HOSPITAL_COMMUNITY): Payer: Medicare PPO

## 2017-11-14 ENCOUNTER — Other Ambulatory Visit (HOSPITAL_COMMUNITY): Payer: Medicare PPO

## 2017-11-17 ENCOUNTER — Encounter (HOSPITAL_COMMUNITY): Payer: Self-pay | Admitting: Psychiatry

## 2017-11-17 ENCOUNTER — Ambulatory Visit (INDEPENDENT_AMBULATORY_CARE_PROVIDER_SITE_OTHER): Payer: Medicare PPO | Admitting: Psychiatry

## 2017-11-17 VITALS — BP 120/74 | HR 80 | Ht 68.0 in | Wt 236.8 lb

## 2017-11-17 DIAGNOSIS — F422 Mixed obsessional thoughts and acts: Secondary | ICD-10-CM

## 2017-11-17 DIAGNOSIS — Z915 Personal history of self-harm: Secondary | ICD-10-CM | POA: Diagnosis not present

## 2017-11-17 DIAGNOSIS — Z62810 Personal history of physical and sexual abuse in childhood: Secondary | ICD-10-CM

## 2017-11-17 DIAGNOSIS — Z818 Family history of other mental and behavioral disorders: Secondary | ICD-10-CM

## 2017-11-17 DIAGNOSIS — M549 Dorsalgia, unspecified: Secondary | ICD-10-CM | POA: Diagnosis not present

## 2017-11-17 DIAGNOSIS — F411 Generalized anxiety disorder: Secondary | ICD-10-CM

## 2017-11-17 DIAGNOSIS — F333 Major depressive disorder, recurrent, severe with psychotic symptoms: Secondary | ICD-10-CM

## 2017-11-17 DIAGNOSIS — Z813 Family history of other psychoactive substance abuse and dependence: Secondary | ICD-10-CM | POA: Diagnosis not present

## 2017-11-17 DIAGNOSIS — G8929 Other chronic pain: Secondary | ICD-10-CM

## 2017-11-17 DIAGNOSIS — F900 Attention-deficit hyperactivity disorder, predominantly inattentive type: Secondary | ICD-10-CM

## 2017-11-17 DIAGNOSIS — Z79899 Other long term (current) drug therapy: Secondary | ICD-10-CM

## 2017-11-17 MED ORDER — METHYLPHENIDATE HCL 20 MG PO TABS: 20.0000 mg | ORAL_TABLET | Freq: Every day | ORAL | 0 refills | Status: DC

## 2017-11-17 MED ORDER — CITALOPRAM HYDROBROMIDE 20 MG PO TABS: 20.0000 mg | ORAL_TABLET | Freq: Every day | ORAL | 2 refills | Status: DC

## 2017-11-17 MED ORDER — PROPRANOLOL HCL 10 MG PO TABS: 10.0000 mg | ORAL_TABLET | Freq: Three times a day (TID) | ORAL | 2 refills | Status: DC

## 2017-11-17 NOTE — Patient Instructions (Signed)
STOP Geodon  STOP Topamax  Stop Prozac  Restart Celexa 20 mg, increase to 40 mg in 2-4 weeks  Propranolol 10 mg 3 times a day - take this every day  ECT scheduling pending consultation
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
## 2017-11-24 ENCOUNTER — Ambulatory Visit (INDEPENDENT_AMBULATORY_CARE_PROVIDER_SITE_OTHER): Payer: Medicare PPO | Admitting: Psychiatry

## 2017-11-24 ENCOUNTER — Encounter: Payer: Self-pay | Admitting: Psychiatry

## 2017-11-24 VITALS — BP 142/81 | HR 58 | Ht 68.0 in | Wt 237.0 lb

## 2017-11-24 DIAGNOSIS — F411 Generalized anxiety disorder: Secondary | ICD-10-CM | POA: Diagnosis not present

## 2017-11-24 DIAGNOSIS — F333 Major depressive disorder, recurrent, severe with psychotic symptoms: Secondary | ICD-10-CM

## 2017-11-24 NOTE — Progress Notes (Signed)
ECT: This is an ECT intake and consultation for 46 year old woman with a history of depression and anxiety referred by her primary psychiatrist.  Met with patient today.  Chart reviewed.  This is a 46 year old woman who reports that her current cycle of severe depression has been going on for a few months now.  She says that it started when she had steroid injections in her back this summer.  She says the symptoms came on essentially overnight and reaction to the steroids.  Since August she has been feeling depressed and overwhelmingly anxious.  She stresses the anxiety as being the worst part of it.  Feels nervous all of the time.  Finds herself feeling that people are looking at her all the time.  Gets very anxious being out in public around people describes herself as being "paranoid".  Seems to have enough insight that is not necessarily psychotic right now.  Sleep is still poor and impaired.  Appetite adequate.  She is taking antidepressant medicine and recently started seeing a new psychiatrist who referred her to ECT treatment.  Patient denies any suicidal thought intent or plan.  Denies currently having any hallucinations.  Social history: Patient is married lives with her husband.  Has several adult children grown and living outside the house.  She has 1 of them expecting a grandchild this month.  Medical history: Patient is in pretty good medical health.  No significant ongoing medical problems other than the chronic back pain.  Substance abuse history: Denies alcohol or drug abuse or any past history of substance abuse problems  Past psychiatric history: Patient has a history of prior episodes of depression.  The last severe episode was several years ago and she received ECT treatment at that time after failing to respond to antidepressant medicine.  She says she had a very positive response to ECT which was performed at [facility].  She says the response was so good that she was actually  able to get off of medication and stay very stable with her mood for years.  No history of suicide attempts no history of violence.  Current medications citalopram Ritalin propranolol.  No reported history of mania.  Family history: Denies any significant family history  Mental status exam: Neatly dressed woman looks her stated age.  Cooperative with the interview.  Affect looks nervous and jittery but she was able to give a very appropriate history and did not appear to be psychotic in her thinking.  Affect blunted as well.  Thoughts lucid.  Short and long-term memory grossly intact.  Able to understand pros and cons of treatment and make reasonable decisions.  Denies any current hallucinations.  Denies suicidal or homicidal thoughts.  46 year old woman with a diagnosis of severe recurrent major depression although it is possible if what she is saying is correct that this could also be a substance induced depression.  Patient feels confident that ECT would be effective even though as she notices her current symptoms are a little different from what she had a few years ago and so far as the anxiety being worse.  We talked about risks and benefits of ECT and the practicalities of treatment here.  Overall the patient is in favor of ECT but she does not want to begin treatment now.  The reason for that is that for one thing she is feeling better than she was a month ago and is optimistic that things will be able to improve.  Probably more important for  her is that there is a grandchild due this month and she does not want to lose short-term memory around that time.  She feels safe in her current situation.  The patient was given lots of support and information.  She is to call back to our office when and if she decides she wants to pursue ECT and I will be willing to start the workup at that point.  I will send a message back to her primary psychiatrist about this.

## 2017-12-01 ENCOUNTER — Encounter (HOSPITAL_COMMUNITY): Payer: Self-pay | Admitting: Licensed Clinical Social Worker

## 2017-12-01 ENCOUNTER — Ambulatory Visit (HOSPITAL_COMMUNITY): Payer: Medicare PPO | Admitting: Licensed Clinical Social Worker

## 2017-12-01 DIAGNOSIS — F331 Major depressive disorder, recurrent, moderate: Secondary | ICD-10-CM | POA: Diagnosis not present

## 2017-12-01 NOTE — Progress Notes (Signed)
   THERAPIST PROGRESS NOTE  Session Time: 11:00am-12:00pm  Participation Level: Active  Behavioral Response: Well GroomedAlertAnxious  Type of Therapy: Individual Therapy  Treatment Goals addressed: improve psychiatric symptoms, emotional regulation skills, healthy coping skills, improve unhelpful thought and belief patterns, calming skills  Interventions: CBT, Motivational Interviewing, grounding and Mindfulness techniques  Summary: Bryanne Riquelme is a 46 y.o. female who presents with Major Depressive Disorder, recurrent, moderate without psychotic features    Suicidal/Homicidal: No without intent/plan  Therapist Response:  [name] met with clinician for an individual session. She discussed her psychiatric symptoms, her current life events, and her homework. Mercede reports she did not complete the task of writing the [facility] list of her strengths, so she and clinician worked on this in session. Clinician discussed interactions with her family and the way she is when with her grandchildren. This made it easier for her to identify positive personality attributes. Clinician also utilized MI OARS to reflect the pride she has in her family and her marriage. Clinician noted many automatic thoughts and reviewed list of automatic thoughts in order to begin CBT processing.    Plan: Return in 3-4 weeks.  Diagnosis: Axis I:  Major Depressive Disorder, recurrent, moderate without psychotic features   Mindi Curling, LCSW 12/01/2017

## 2018-01-05 ENCOUNTER — Encounter (HOSPITAL_COMMUNITY): Payer: Self-pay | Admitting: Licensed Clinical Social Worker

## 2018-01-05 ENCOUNTER — Ambulatory Visit (HOSPITAL_COMMUNITY): Payer: Medicare HMO | Admitting: Licensed Clinical Social Worker

## 2018-01-05 DIAGNOSIS — F331 Major depressive disorder, recurrent, moderate: Secondary | ICD-10-CM

## 2018-01-05 DIAGNOSIS — R69 Illness, unspecified: Secondary | ICD-10-CM | POA: Diagnosis not present

## 2018-01-05 NOTE — Progress Notes (Signed)
THERAPIST PROGRESS NOTE  Session Time: 10:10am-11:00am  Participation Level: Active  Behavioral Response: Well GroomedAlertDepressed  Type of Therapy: Individual Therapy  Treatment Goals addressed: improve psychiatric symptoms, emotional regulation skills, healthy coping skills, improve unhelpful thought and belief patterns, calming skills  Interventions: CBT, Motivational Interviewing, grounding and Mindfulness techniques  Summary: [name] is a 46 y.o. female who presents with Major Depressive Disorder, recurrent, moderate without psychotic features    Suicidal/Homicidal: No without intent/plan  Therapist Response:  [name] met with clinician for an individual session. She discussed her psychiatric symptoms, her current life events, and her homework. [name] shared increased depression since the holidays and a sense of being completely overwhelmed. [name] reports she has not taken down her Christmas decorations yet, which is unlike her. She also reports no motivation to do a lot of basic things. She also reported feeling guilty for asking for help from any of her children due to not wanting to be a "burden".  Clinician provided psycheoducation about CBT triangle. Clinician also provided list of cognitive distortions and reviewed them with [name] in order to identify common experiences. Clinician discussed the importance of baby steps and working on one piece at a time, as well as the importance of asking for help.    Plan: Return in 3-4 weeks.  Diagnosis: Axis I:  Major Depressive Disorder, recurrent, moderate without psychotic features    R , LCSW 01/05/2018  

## 2018-01-19 ENCOUNTER — Encounter (HOSPITAL_COMMUNITY): Payer: Self-pay | Admitting: Licensed Clinical Social Worker

## 2018-01-19 ENCOUNTER — Ambulatory Visit (HOSPITAL_COMMUNITY): Payer: Medicare HMO | Admitting: Licensed Clinical Social Worker

## 2018-01-19 DIAGNOSIS — F331 Major depressive disorder, recurrent, moderate: Secondary | ICD-10-CM | POA: Diagnosis not present

## 2018-01-19 DIAGNOSIS — R69 Illness, unspecified: Secondary | ICD-10-CM | POA: Diagnosis not present

## 2018-01-19 NOTE — Progress Notes (Signed)
   THERAPIST PROGRESS NOTE  Session Time: 10:00am-11:00am  Participation Level: Active  Behavioral Response: Well GroomedAlertEuthymic  Type of Therapy: Individual Therapy  Treatment Goals addressed: improve psychiatric symptoms, emotional regulation skills, healthy coping skills, improve unhelpful thought and belief patterns, calming skills  Interventions: CBT, Motivational Interviewing, grounding and Mindfulness techniques  Summary: [name] is a 47 y.o. female who presents with Major Depressive Disorder, recurrent, moderate without psychotic features    Suicidal/Homicidal: No without intent/plan  Therapist Response:  [name] met with clinician for an individual session. She discussed her psychiatric symptoms, her current life events, and her homework. [name] reports she has had some good days and some bad days. She reports she made some progress cleaning up the house from the Christmas holiday. However, she has not yet taken down her tree. [name] reports it has been helpful to do one thing and take a break before tackling the next task. [name] reports she has been spending more time with her children and grandchildren, which has been very helpful for her. She reports the children make her life happier and can change her mood. [name] also reports she has been spending a lot of time with her pets, which soothe her. Clinician explored options for volunteering, in order to get her out of the house. Clinician noted the importance of routine and responsibility in order to maintain accountability and motivation.    Plan: Return in 3-4 weeks.  Diagnosis: Axis I:  Major Depressive Disorder, recurrent, moderate without psychotic features   Mindi Curling, LCSW 01/19/2018

## 2018-02-02 ENCOUNTER — Ambulatory Visit (HOSPITAL_COMMUNITY): Payer: Medicare HMO | Admitting: Licensed Clinical Social Worker

## 2018-02-02 ENCOUNTER — Encounter (HOSPITAL_COMMUNITY): Payer: Self-pay | Admitting: Licensed Clinical Social Worker

## 2018-02-02 DIAGNOSIS — F331 Major depressive disorder, recurrent, moderate: Secondary | ICD-10-CM | POA: Diagnosis not present

## 2018-02-02 DIAGNOSIS — R69 Illness, unspecified: Secondary | ICD-10-CM | POA: Diagnosis not present

## 2018-02-02 NOTE — Progress Notes (Signed)
   THERAPIST PROGRESS NOTE  Session Time: 11:00am-12:00pm  Participation Level: Active  Behavioral Response: Well GroomedAlertAnxious  Type of Therapy: Individual Therapy  Treatment Goals addressed: improve psychiatric symptoms, emotional regulation skills, healthy coping skills, improve unhelpful thought and belief patterns, calming skills  Interventions: CBT, Motivational Interviewing, grounding and Mindfulness techniques  Summary: [name] is a 47 y.o. female who presents with Major Depressive Disorder, recurrent, moderate without psychotic features    Suicidal/Homicidal: No without intent/plan  Therapist Response:  [name] met with clinician for an individual session. She discussed her psychiatric symptoms, her current life events, and her homework. Emmory reports she felt very upset yesterday due to a racial statement made in church. She reports this really bothered her and her husband, but she reports her husband minimized the impact. She identified that this upset feeling extended into other interactions over the course of the day and blossomed in to a generalized theme that her husband does not value her opinion. [name] processed her thoughts and feelings with clinician. She also reports she usually does not feel this way or at least make her feelings known. Allisha reports generally she does not mind if her husband makes decisions, but she also identified that this has been ongoing since they were teenagers. Clinician noted that her self esteem seems to be improving, which may be why she has become more vocal about her opinions.    Plan: Return in 3-4 weeks.  Diagnosis: Axis I:  Major Depressive Disorder, recurrent, moderate without psychotic features    Mindi Curling, LCSW 02/02/2018

## 2018-02-10 DIAGNOSIS — J0101 Acute recurrent maxillary sinusitis: Secondary | ICD-10-CM | POA: Diagnosis not present

## 2018-02-16 ENCOUNTER — Ambulatory Visit (HOSPITAL_COMMUNITY): Payer: Self-pay | Admitting: Licensed Clinical Social Worker

## 2018-02-17 ENCOUNTER — Ambulatory Visit (HOSPITAL_COMMUNITY): Payer: Medicare HMO | Admitting: Psychiatry

## 2018-02-17 ENCOUNTER — Encounter (HOSPITAL_COMMUNITY): Payer: Self-pay | Admitting: Psychiatry

## 2018-02-17 ENCOUNTER — Telehealth (HOSPITAL_COMMUNITY): Payer: Self-pay

## 2018-02-17 ENCOUNTER — Other Ambulatory Visit (HOSPITAL_COMMUNITY): Payer: Self-pay | Admitting: Psychiatry

## 2018-02-17 VITALS — BP 128/80 | HR 73 | Ht 68.0 in | Wt 247.0 lb

## 2018-02-17 DIAGNOSIS — F333 Major depressive disorder, recurrent, severe with psychotic symptoms: Secondary | ICD-10-CM | POA: Diagnosis not present

## 2018-02-17 DIAGNOSIS — F411 Generalized anxiety disorder: Secondary | ICD-10-CM

## 2018-02-17 DIAGNOSIS — R69 Illness, unspecified: Secondary | ICD-10-CM | POA: Diagnosis not present

## 2018-02-17 DIAGNOSIS — Z79899 Other long term (current) drug therapy: Secondary | ICD-10-CM | POA: Diagnosis not present

## 2018-02-17 DIAGNOSIS — Z818 Family history of other mental and behavioral disorders: Secondary | ICD-10-CM | POA: Diagnosis not present

## 2018-02-17 DIAGNOSIS — Z813 Family history of other psychoactive substance abuse and dependence: Secondary | ICD-10-CM

## 2018-02-17 DIAGNOSIS — F331 Major depressive disorder, recurrent, moderate: Secondary | ICD-10-CM

## 2018-02-17 MED ORDER — METHYLPHENIDATE HCL 20 MG PO TABS: 20.0000 mg | ORAL_TABLET | Freq: Every day | ORAL | 0 refills | Status: DC

## 2018-02-17 MED ORDER — CITALOPRAM HYDROBROMIDE 40 MG PO TABS: 40.0000 mg | ORAL_TABLET | Freq: Every day | ORAL | 1 refills | Status: DC

## 2018-02-17 NOTE — Telephone Encounter (Signed)
[name] doke sent correct script

## 2018-02-17 NOTE — Progress Notes (Signed)
[place] MD/PA/NP OP Progress Note  02/17/2018 9:06 AM [name]  MRN:  [id number]  Chief Complaint: med management  HPI: [name] presents for med management.  She feels remarkably improved with Celexa in conjunction with regular individual therapy.  She is learning how not to catastrophize some of her anxiety.  She feels like her mood and depression have been much better and is going to hold off on ECT for now.  She has no significant side effects except for some mild hair loss, which she notices, but other people have not noticed.  She is taking Centrum Silver as she has read that that is effective for reducing some of the hair loss side effects from SSRI.  She denies any acute safety issues or substance use.  She will follow-up with writer in 3 months or sooner if needed.  We agreed to continue Celexa and Ritalin, and discontinue propranolol given generally intermittent use, she has used it twice in the past month.  Visit Diagnosis:    ICD-10-CM   1. Generalized anxiety disorder F41.1   2. Severe episode of recurrent major depressive disorder, with psychotic features ([place]) F33.3 citalopram (CELEXA) 40 MG tablet  3. MDD (major depressive disorder), recurrent episode, moderate ([place]) F33.1     Past Psychiatric History: See intake H&P for full details. Reviewed, with no updates at this time.   Past Medical History:  Past Medical History:  Diagnosis Date  . ADHD (attention deficit hyperactivity disorder)   . Anxiety    ptsd  . Depression    with psychosis, bipolar, ect therapy  . Goiter    multinodualr  . Herniated disc, cervical 04/2017   Has 2 herniated discs  . OCD (obsessive compulsive disorder)   . Suicidal behavior     Past Surgical History:  Procedure Laterality Date  . ABDOMINAL HYSTERECTOMY    . CHOLECYSTECTOMY    . CRYOTHERAPY     cervix  . TUBAL LIGATION      Family Psychiatric History: See intake H&P for full details. Reviewed, with no updates at this  time.   Family History:  Family History  Problem Relation Age of Onset  . Arthritis Other   . Diabetes Other   . ADD / ADHD Other   . Schizophrenia Father   . Drug abuse Father   . Bipolar disorder Mother   . Depression Mother   . Anxiety disorder Sister   . Anxiety disorder Brother   . Physical abuse Brother   . OCD Other   . Anxiety disorder Other     Social History:  Social History   Socioeconomic History  . Marital status: Married    Spouse name: None  . Number of children: None  . Years of education: None  . Highest education level: None  Social Needs  . Financial resource strain: None  . Food insecurity - worry: None  . Food insecurity - inability: None  . Transportation needs - medical: None  . Transportation needs - non-medical: None  Occupational History  . None  Tobacco Use  . Smoking status: Never Smoker  . Smokeless tobacco: Never Used  Substance and Sexual Activity  . Alcohol use: No  . Drug use: No  . Sexual activity: Yes    Partners: Male    Birth control/protection: Surgical  Other Topics Concern  . None  Social History Narrative  . None    Allergies:  Allergies  Allergen Reactions  . Ceftriaxone Sodium  REACTION: unspecified  . Lorazepam     REACTION: unspecified    Metabolic Disorder Labs: No results found for: HGBA1C, MPG No results found for: PROLACTIN No results found for: CHOL, TRIG, HDL, CHOLHDL, VLDL, LDLCALC   Therapeutic Level Labs: No results found for: LITHIUM No results found for: VALPROATE No components found for:  CBMZ  Current Medications: Current Outpatient Medications  Medication Sig Dispense Refill  . calcium carbonate (OS-CAL) 600 MG TABS Take 600 mg by mouth daily.      . cholecalciferol (VITAMIN D) 1000 UNITS tablet Take 1,000 Units by mouth daily.      . citalopram (CELEXA) 40 MG tablet Take 1 tablet (40 mg total) by mouth daily. Increase to 40 mg (2 tablets) in 2-4 weeks as tolerated 90 tablet 1   . FOLIC ACID PO Take by mouth daily. Take 2 mg     . methylphenidate (RITALIN) 20 MG tablet Take 1 tablet (20 mg total) by mouth daily before breakfast. 30 tablet 0  . [START ON 03/20/2018] methylphenidate (RITALIN) 20 MG tablet Take 1 tablet (20 mg total) by mouth daily before breakfast. 30 tablet 0  . [START ON 04/20/2018] methylphenidate (RITALIN) 20 MG tablet Take 1 tablet (20 mg total) by mouth daily before breakfast. 30 tablet 0   No current facility-administered medications for this visit.      Musculoskeletal: Strength & Muscle Tone: within normal limits Gait & Station: normal Patient leans: N/A  Psychiatric Specialty Exam: ROS  Blood pressure 128/80, pulse 73, height 5\' 8"  (1.727 m), weight 247 lb (112 kg).Body mass index is 37.56 kg/m.  General Appearance: Casual and Well Groomed  Eye Contact:  Good  Speech:  Clear and Coherent and Normal Rate  Volume:  Normal  Mood:  Euthymic  Affect:  Appropriate and Congruent  Thought Process:  Goal Directed and Descriptions of Associations: Intact  Orientation:  Full (Time, Place, and Person)  Thought Content: Logical   Suicidal Thoughts:  No  Homicidal Thoughts:  No  Memory:  Immediate;   Good  Judgement:  Good  Insight:  Good  Psychomotor Activity:  Normal  Concentration:  Concentration: Good  Recall:  Good  Fund of Knowledge: Good  Language: Good  Akathisia:  Negative  Handed:  Right  AIMS (if indicated): not done  Assets:  Communication Skills Desire for Improvement Financial Resources/Insurance Housing Intimacy Leisure Time [place] Talents/Skills Transportation Vocational/Educational  ADL's:  Intact  Cognition: WNL  Sleep:  Good   Screenings: GAD-7     Counselor from 09/19/2017 in [place] Counselor from 09/02/2017 in [place]  Total GAD-7 Score  10  14    PHQ2-9     Counselor from 09/19/2017  in [place] Counselor from 09/12/2017 in [place] Counselor from 09/02/2017 in [place]  PHQ-2 Total Score  3  6  5   PHQ-9 Total Score  15  21  21        Assessment and Plan:  [name] presents with improving mood symptoms, continuing to struggle with generalized anxiety.  She continues to benefit from regular individual therapy as she struggles with catastrophize and, and struggles to remain organized in her thinking in the face of anxiety.  She does not have any psychotic symptoms.  She remains on Celexa and Ritalin with good response in terms of gradual mood improvements, and generally tolerated well.  She has some mild hair loss that she is addressing with the use of Centrum Silver.  We will follow-up in 3 months or sooner if needed.  1. Generalized anxiety disorder   2. Severe episode of recurrent major depressive disorder, with psychotic features ([place])   3. MDD (major depressive disorder), recurrent episode, moderate ([facility])     Status of current problems: gradually improving  Labs Ordered: No orders of the defined types were placed in this encounter.   Labs Reviewed: n/a  Collateral Obtained/Records Reviewed: n/a  Plan:  Continue Celexa 40 mg daily Continue Ritalin 20 mg daily; 3 months prescribed Continue in individual therapy which appears to be providing benefit for anxiety reduction and coping strategies Return to clinic in 3 months Propranolol discontinued, patient using very sparingly  I spent 20 minutes with the patient in direct face-to-face clinical care.  Greater than 50% of this time was spent in counseling and coordination of care with the patient.    Aundra Dubin, MD 02/17/2018, 9:06 AM

## 2018-02-17 NOTE — Telephone Encounter (Signed)
Patients pharmacy called, they need you to clarify the citalopram prescription

## 2018-02-18 ENCOUNTER — Ambulatory Visit (HOSPITAL_COMMUNITY): Payer: Medicare HMO | Admitting: Licensed Clinical Social Worker

## 2018-02-18 ENCOUNTER — Encounter (HOSPITAL_COMMUNITY): Payer: Self-pay | Admitting: Licensed Clinical Social Worker

## 2018-02-18 DIAGNOSIS — R69 Illness, unspecified: Secondary | ICD-10-CM | POA: Diagnosis not present

## 2018-02-18 DIAGNOSIS — F331 Major depressive disorder, recurrent, moderate: Secondary | ICD-10-CM

## 2018-02-18 NOTE — Progress Notes (Signed)
   THERAPIST PROGRESS NOTE  Session Time: 3:30pm-4:30pm  Participation Level: Active  Behavioral Response: Well GroomedAlertAnxious  Type of Therapy: Family Therapy  Treatment Goals addressed: improve psychiatric symptoms, emotional regulation skills, healthy coping skills, improve unhelpful thought and belief patterns, calming skills  Interventions: CBT, Motivational Interviewing, grounding and Mindfulness techniques  Summary: [name] is a 47 y.o. female who presents with Major Depressive Disorder, recurrent, moderate without psychotic features    Suicidal/Homicidal: No without intent/plan  Therapist Response:  [name] and her husband Shanon Brow met with clinician for a family session. She discussed her psychiatric symptoms, her current life events, and her homework. [name] reports she thinks she has been doing better over the past few weeks. She noted some improvement in her ability to say no to her children when she needs to. She also processed recent experience of needing to ask for help, but feeling like a burden. Clinician and [name] processed thoughts and feelings, utilizing CBT reality testing. [name] reports she does not want to "inconvenience" anyone. Clinician explored the role of self esteem in this statement. Clinician also confronted tendency to use automatic thoughts "shoulds" and all or nothing thinking. [name] and husband discussed ways to work together to improve self esteem and to get some further support at home.    Plan: Return in 2-3 weeks.  Diagnosis: Axis I:  Major Depressive Disorder, recurrent, moderate without psychotic features   Mindi Curling, LCSW 02/18/2018

## 2018-03-02 ENCOUNTER — Ambulatory Visit (HOSPITAL_COMMUNITY): Payer: Medicare HMO | Admitting: Licensed Clinical Social Worker

## 2018-03-02 ENCOUNTER — Encounter (HOSPITAL_COMMUNITY): Payer: Self-pay | Admitting: Licensed Clinical Social Worker

## 2018-03-02 DIAGNOSIS — F411 Generalized anxiety disorder: Secondary | ICD-10-CM

## 2018-03-02 DIAGNOSIS — R69 Illness, unspecified: Secondary | ICD-10-CM | POA: Diagnosis not present

## 2018-03-02 DIAGNOSIS — F330 Major depressive disorder, recurrent, mild: Secondary | ICD-10-CM | POA: Diagnosis not present

## 2018-03-02 NOTE — Progress Notes (Signed)
   THERAPIST PROGRESS NOTE  Session Time: 11:00am-11:55am  Participation Level: Active  Behavioral Response: Well GroomedAlertAnxious  Type of Therapy: Individual Therapy  Treatment Goals addressed: improve psychiatric symptoms, emotional regulation skills, healthy coping skills, improve unhelpful thought and belief patterns, calming skills  Interventions: CBT, Motivational Interviewing, grounding and Mindfulness techniques  Summary: [name] is a 47 y.o. female who presents with Generalized Anxiety Disorder and  Major Depressive Disorder, recurrent, mild    Suicidal/Homicidal: No without intent/plan  Therapist Response:  [name] met with clinician for an individual session. She discussed her psychiatric symptoms, her current life events, and her homework. [name] reports she has been focused on getting ready to start watching her granddaughter. [name] reports she is very excited and this gives her a routine and chores to complete with the baby. [name] reports she is feeling scattered often and has some difficulty with focus. She reports the Ritalin helps, but she rarely takes the recommended dose, she instead splits one pill in half and takes half in AM and half at noon. She reports she has notified Dr. Daron Offer about this and he is okay as long as it works for her. However, [name] reports she probably should take the medication as prescribed, due to not being able to focus as well as she can with the full dose. Clinician discussed mood, anxiety, and depression levels. Clinician processed ways to cope with these moods and encouraged [name] to continue doing the things she likes and asking for help when she needs it.  Diagnosis updated in session to include Generalized Anxiety Disorder as primary diagnosis.    Plan: Return in 2-3 weeks.  Diagnosis: Axis I:  Generalized Anxiety Disorder and Major Depressive Disorder, recurrent, mild   Mindi Curling, LCSW 03/02/2018

## 2018-03-08 DIAGNOSIS — J019 Acute sinusitis, unspecified: Secondary | ICD-10-CM | POA: Diagnosis not present

## 2018-03-08 DIAGNOSIS — J069 Acute upper respiratory infection, unspecified: Secondary | ICD-10-CM | POA: Diagnosis not present

## 2018-03-14 DIAGNOSIS — J209 Acute bronchitis, unspecified: Secondary | ICD-10-CM | POA: Diagnosis not present

## 2018-03-16 ENCOUNTER — Ambulatory Visit (HOSPITAL_COMMUNITY): Payer: Self-pay | Admitting: Licensed Clinical Social Worker

## 2018-03-30 ENCOUNTER — Ambulatory Visit (HOSPITAL_COMMUNITY): Payer: Medicare HMO | Admitting: Licensed Clinical Social Worker

## 2018-04-13 ENCOUNTER — Encounter (HOSPITAL_COMMUNITY): Payer: Self-pay | Admitting: Licensed Clinical Social Worker

## 2018-04-13 ENCOUNTER — Ambulatory Visit (HOSPITAL_COMMUNITY): Payer: Medicare HMO | Admitting: Licensed Clinical Social Worker

## 2018-04-13 DIAGNOSIS — F411 Generalized anxiety disorder: Secondary | ICD-10-CM

## 2018-04-13 DIAGNOSIS — R69 Illness, unspecified: Secondary | ICD-10-CM | POA: Diagnosis not present

## 2018-04-13 NOTE — Progress Notes (Signed)
   THERAPIST PROGRESS NOTE  Session Time: 11:00am-12:00pm  Participation Level: Active  Behavioral Response: Well GroomedAlertAnxious  Type of Therapy: Individual Therapy  Treatment Goals addressed: improve psychiatric symptoms, emotional regulation skills, healthy coping skills, improve unhelpful thought and belief patterns, calming skills  Interventions: CBT, Motivational Interviewing, grounding and Mindfulness techniques  Summary: [name] is a 47 y.o. female who presents with Generalized Anxiety Disorder   Suicidal/Homicidal: No without intent/plan  Therapist Response:  [name] met with clinician for an individual session. She discussed her psychiatric symptoms, her current life events, and her homework. [name] reports increased anxiety over the past few weeks due to illness and current drama between her twin sons. [name] reports her children are currently having problems due to finances and one owing the other money. Clinician utilized MI OARS to reflect and summarize thoughts and feelings. Clinician explored options for [name] to get out of this situation between her two sons. Clinician reflected need as a mother to ensure peace between her children. However, clinician also noted the importance of steering clear, as the more involved she becomes, the more responsible she becomes for the outcomes.    Plan: Return in 3-4 weeks.  Diagnosis: Axis I:  Generalized Anxiety Disorder  Kiara Curling, LCSW 04/13/2018

## 2018-04-27 ENCOUNTER — Ambulatory Visit (HOSPITAL_COMMUNITY): Payer: Medicare HMO | Admitting: Licensed Clinical Social Worker

## 2018-05-08 DIAGNOSIS — R319 Hematuria, unspecified: Secondary | ICD-10-CM | POA: Diagnosis not present

## 2018-05-08 DIAGNOSIS — N390 Urinary tract infection, site not specified: Secondary | ICD-10-CM | POA: Diagnosis not present

## 2018-05-18 ENCOUNTER — Encounter (HOSPITAL_COMMUNITY): Payer: Self-pay | Admitting: Psychiatry

## 2018-05-18 ENCOUNTER — Ambulatory Visit (HOSPITAL_COMMUNITY): Payer: Medicare HMO | Admitting: Psychiatry

## 2018-05-18 VITALS — BP 123/79 | HR 76 | Ht 68.0 in | Wt 254.0 lb

## 2018-05-18 DIAGNOSIS — F331 Major depressive disorder, recurrent, moderate: Secondary | ICD-10-CM | POA: Diagnosis not present

## 2018-05-18 DIAGNOSIS — Z813 Family history of other psychoactive substance abuse and dependence: Secondary | ICD-10-CM | POA: Diagnosis not present

## 2018-05-18 DIAGNOSIS — Z818 Family history of other mental and behavioral disorders: Secondary | ICD-10-CM | POA: Diagnosis not present

## 2018-05-18 DIAGNOSIS — F411 Generalized anxiety disorder: Secondary | ICD-10-CM

## 2018-05-18 DIAGNOSIS — R69 Illness, unspecified: Secondary | ICD-10-CM | POA: Diagnosis not present

## 2018-05-18 MED ORDER — CITALOPRAM HYDROBROMIDE 20 MG PO TABS: 20.0000 mg | ORAL_TABLET | Freq: Every day | ORAL | 0 refills | Status: DC

## 2018-05-18 MED ORDER — BUPROPION HCL ER (XL) 150 MG PO TB24: 150.0000 mg | ORAL_TABLET | ORAL | 0 refills | Status: DC

## 2018-05-18 NOTE — Progress Notes (Signed)
BH MD/PA/NP OP Progress Note  05/18/2018 10:07 AM [name]  MRN:  [id number]  Chief Complaint: med check, fatigue, anxiety is better HPI: [name] presents 17 minutes late for her visit.  Reports that she continues to struggle with the sweating, not sure if it is related to Ritalin or Celexa.  Educated her that this is most likely related to Ritalin, so we can discontinue and consider other augmenting strategies.  She continues on Celexa at 20 mg daily, because the higher dose was causing some hair loss.  We discussed augmentation with Wellbutrin XL 150 mg, confirmed that she does not have any history of seizures.  Educated her on the risks and benefits of Wellbutrin, and the medicine must be taken in the morning..  Educated her that Probation officer is transitioning out of office in 3 months, and we can assist her in transitioning care to Dr. Adele Schilder, or she may consider inquiring whether her primary care doctor would be comfortable to continue Celexa and Wellbutrin if she finds a stable dose and regimen with this combination.  Visit Diagnosis:    ICD-10-CM   1. GAD (generalized anxiety disorder) F41.1   2. Moderate episode of recurrent major depressive disorder (HCC) F33.1 citalopram (CELEXA) 20 MG tablet    buPROPion (WELLBUTRIN XL) 150 MG 24 hr tablet    Past Psychiatric History: See intake H&P for full details. Reviewed, with no updates at this time.   Past Medical History:  Past Medical History:  Diagnosis Date  . ADHD (attention deficit hyperactivity disorder)   . Anxiety    ptsd  . Depression    with psychosis, bipolar, ect therapy  . Goiter    multinodualr  . Herniated disc, cervical 04/2017   Has 2 herniated discs  . OCD (obsessive compulsive disorder)   . Suicidal behavior     Past Surgical History:  Procedure Laterality Date  . ABDOMINAL HYSTERECTOMY    . CHOLECYSTECTOMY    . CRYOTHERAPY     cervix  . TUBAL LIGATION      Family Psychiatric History: See intake  H&P for full details. Reviewed, with no updates at this time.   Family History:  Family History  Problem Relation Age of Onset  . Arthritis Other   . Diabetes Other   . ADD / ADHD Other   . Schizophrenia Father   . Drug abuse Father   . Bipolar disorder Mother   . Depression Mother   . Anxiety disorder Sister   . Anxiety disorder Brother   . Physical abuse Brother   . OCD Other   . Anxiety disorder Other     Social History:  Social History   Socioeconomic History  . Marital status: Married    Spouse name: Not on file  . Number of children: Not on file  . Years of education: Not on file  . Highest education level: Not on file  Occupational History  . Not on file  Social Needs  . Financial resource strain: Not on file  . Food insecurity:    Worry: Not on file    Inability: Not on file  . Transportation needs:    Medical: Not on file    Non-medical: Not on file  Tobacco Use  . Smoking status: Never Smoker  . Smokeless tobacco: Never Used  Substance and Sexual Activity  . Alcohol use: No  . Drug use: No  . Sexual activity: Yes    Partners: Male    Birth  control/protection: Surgical  Lifestyle  . Physical activity:    Days per week: Not on file    Minutes per session: Not on file  . Stress: Not on file  Relationships  . Social connections:    Talks on phone: Not on file    Gets together: Not on file    Attends religious service: Not on file    Active member of club or organization: Not on file    Attends meetings of clubs or organizations: Not on file    Relationship status: Not on file  Other Topics Concern  . Not on file  Social History Narrative  . Not on file    Allergies:  Allergies  Allergen Reactions  . Ceftriaxone Sodium     REACTION: unspecified  . Lorazepam     REACTION: unspecified    Metabolic Disorder Labs: No results found for: HGBA1C, MPG No results found for: PROLACTIN No results found for: CHOL, TRIG, HDL, CHOLHDL, VLDL,  LDLCALC   Therapeutic Level Labs: No results found for: LITHIUM No results found for: VALPROATE No components found for:  CBMZ  Current Medications: Current Outpatient Medications  Medication Sig Dispense Refill  . buPROPion (WELLBUTRIN XL) 150 MG 24 hr tablet Take 1 tablet (150 mg total) by mouth every morning. 90 tablet 0  . calcium carbonate (OS-CAL) 600 MG TABS Take 600 mg by mouth daily.      . cholecalciferol (VITAMIN D) 1000 UNITS tablet Take 1,000 Units by mouth daily.      . citalopram (CELEXA) 20 MG tablet Take 1 tablet (20 mg total) by mouth daily. 90 tablet 0  . FOLIC ACID PO Take by mouth daily. Take 2 mg      No current facility-administered medications for this visit.      Musculoskeletal: Strength & Muscle Tone: within normal limits Gait & Station: normal Patient leans: N/A  Psychiatric Specialty Exam: ROS  Blood pressure 123/79, pulse 76, height 5\' 8"  (1.727 m), weight 254 lb (115.2 kg), SpO2 100 %.Body mass index is 38.62 kg/m.  General Appearance: Casual and Well Groomed  Eye Contact:  Good  Speech:  Clear and Coherent  Volume:  Normal  Mood:  Euthymic and fatigue  Affect:  Appropriate  Thought Process:  Coherent and Descriptions of Associations: Intact  Orientation:  Full (Time, Place, and Person)  Thought Content: Logical   Suicidal Thoughts:  No  Homicidal Thoughts:  No  Memory:  Immediate;   Good  Judgement:  Fair  Insight:  Fair  Psychomotor Activity:  Normal  Concentration:  Attention Span: Good  Recall:  Good  Fund of Knowledge: Good  Language: Good  Akathisia:  Negative  Handed:  Right  AIMS (if indicated): not done  Assets:  Communication Skills Desire for Improvement Financial Resources/Insurance Housing  ADL's:  Intact  Cognition: WNL  Sleep:  Good   Screenings: GAD-7     Counselor from 09/19/2017 in [facility] Counselor from 09/02/2017 in [facility]   Total GAD-7 Score  10  14    PHQ2-9     Counselor from 09/19/2017 in [facility] Counselor from 09/12/2017 in [facility] Counselor from 09/02/2017 in [facility]  PHQ-2 Total Score  3  6  5   PHQ-9 Total Score  15  21  21        Assessment and Plan:  [name] presents with some improvement in  her anxiety with Celexa 20 mg, and feels like her mood and anxiety in this regard are at a place where she can manage things better.  She takes methylphenidate most days, but often takes holidays from the medicine.  She does notice that when she does not take it it is harder for her to have "get up and go", but there is no substantial withdrawal.  She does have significant sweating side effects from the Ritalin.  I suggested we discontinue Ritalin and initiate Wellbutrin for augmentation as below.  Confirmed no history of seizures/epilepsy.  Disclosed to patient that this Probation officer is leaving this practice at the end of August 2019, and patients always has the right to choose their provider. Reassured patient that office will work to provide smooth transition of care whether they wish to remain at this office, or to continue with this provider, or seek alternative care options in community.  They expressed understanding.   1. GAD (generalized anxiety disorder)   2. Moderate episode of recurrent major depressive disorder (HCC)     Status of current problems: gradually improving  Labs Ordered: No orders of the defined types were placed in this encounter.   Labs Reviewed: Reviewed recent labs from primary care provider, investigating for etiologies of fatigue  Collateral Obtained/Records Reviewed: NA  Plan:  Discontinue Ritalin given sweating and ongoing anxiety Continue Celexa 20 mg daily; patient self tapered to 20 mg approximately 6 weeks ago because of hair loss with her 40 mg  dose Initiate Wellbutrin XL 150 mg daily   I spent 20 minutes with the patient in direct face-to-face clinical care.  Greater than 50% of this time was spent in counseling and coordination of care with the patient.    Aundra Dubin, MD 05/18/2018, 10:07 AM

## 2018-07-15 ENCOUNTER — Ambulatory Visit (HOSPITAL_COMMUNITY): Payer: Medicare HMO | Admitting: Psychiatry

## 2018-08-25 DIAGNOSIS — F988 Other specified behavioral and emotional disorders with onset usually occurring in childhood and adolescence: Secondary | ICD-10-CM | POA: Diagnosis not present

## 2018-08-25 DIAGNOSIS — F411 Generalized anxiety disorder: Secondary | ICD-10-CM | POA: Diagnosis not present

## 2018-08-25 DIAGNOSIS — R69 Illness, unspecified: Secondary | ICD-10-CM | POA: Diagnosis not present

## 2018-08-25 DIAGNOSIS — F4310 Post-traumatic stress disorder, unspecified: Secondary | ICD-10-CM | POA: Diagnosis not present

## 2018-10-19 DIAGNOSIS — R05 Cough: Secondary | ICD-10-CM | POA: Diagnosis not present

## 2018-10-19 DIAGNOSIS — J0100 Acute maxillary sinusitis, unspecified: Secondary | ICD-10-CM | POA: Diagnosis not present

## 2018-10-19 DIAGNOSIS — R69 Illness, unspecified: Secondary | ICD-10-CM | POA: Diagnosis not present

## 2018-10-19 DIAGNOSIS — R062 Wheezing: Secondary | ICD-10-CM | POA: Diagnosis not present

## 2018-10-21 DIAGNOSIS — B349 Viral infection, unspecified: Secondary | ICD-10-CM | POA: Diagnosis not present

## 2018-10-21 DIAGNOSIS — J45909 Unspecified asthma, uncomplicated: Secondary | ICD-10-CM | POA: Diagnosis not present

## 2018-10-26 DIAGNOSIS — R05 Cough: Secondary | ICD-10-CM | POA: Diagnosis not present

## 2018-10-28 DIAGNOSIS — R69 Illness, unspecified: Secondary | ICD-10-CM | POA: Diagnosis not present

## 2018-10-28 DIAGNOSIS — F411 Generalized anxiety disorder: Secondary | ICD-10-CM | POA: Diagnosis not present

## 2018-10-28 DIAGNOSIS — F410 Panic disorder [episodic paroxysmal anxiety] without agoraphobia: Secondary | ICD-10-CM | POA: Diagnosis not present

## 2019-01-06 DIAGNOSIS — R69 Illness, unspecified: Secondary | ICD-10-CM | POA: Diagnosis not present

## 2019-01-06 DIAGNOSIS — Z13220 Encounter for screening for lipoid disorders: Secondary | ICD-10-CM | POA: Diagnosis not present

## 2019-01-06 DIAGNOSIS — Z6839 Body mass index (BMI) 39.0-39.9, adult: Secondary | ICD-10-CM | POA: Diagnosis not present

## 2019-01-06 DIAGNOSIS — M25529 Pain in unspecified elbow: Secondary | ICD-10-CM | POA: Diagnosis not present

## 2019-01-06 DIAGNOSIS — Z1239 Encounter for other screening for malignant neoplasm of breast: Secondary | ICD-10-CM | POA: Diagnosis not present

## 2019-01-06 DIAGNOSIS — R002 Palpitations: Secondary | ICD-10-CM | POA: Diagnosis not present

## 2019-01-06 DIAGNOSIS — E0590 Thyrotoxicosis, unspecified without thyrotoxic crisis or storm: Secondary | ICD-10-CM | POA: Diagnosis not present

## 2019-01-06 DIAGNOSIS — Z0000 Encounter for general adult medical examination without abnormal findings: Secondary | ICD-10-CM | POA: Diagnosis not present

## 2019-01-08 ENCOUNTER — Other Ambulatory Visit: Payer: Self-pay | Admitting: Family Medicine

## 2019-01-08 DIAGNOSIS — Z1231 Encounter for screening mammogram for malignant neoplasm of breast: Secondary | ICD-10-CM

## 2019-01-09 DIAGNOSIS — R509 Fever, unspecified: Secondary | ICD-10-CM | POA: Diagnosis not present

## 2019-01-12 DIAGNOSIS — M542 Cervicalgia: Secondary | ICD-10-CM | POA: Diagnosis not present

## 2019-01-29 DIAGNOSIS — N951 Menopausal and female climacteric states: Secondary | ICD-10-CM | POA: Diagnosis not present

## 2019-01-29 DIAGNOSIS — R635 Abnormal weight gain: Secondary | ICD-10-CM | POA: Diagnosis not present

## 2019-02-02 DIAGNOSIS — G479 Sleep disorder, unspecified: Secondary | ICD-10-CM | POA: Diagnosis not present

## 2019-02-02 DIAGNOSIS — E559 Vitamin D deficiency, unspecified: Secondary | ICD-10-CM | POA: Diagnosis not present

## 2019-02-02 DIAGNOSIS — Z1331 Encounter for screening for depression: Secondary | ICD-10-CM | POA: Diagnosis not present

## 2019-02-02 DIAGNOSIS — Z6838 Body mass index (BMI) 38.0-38.9, adult: Secondary | ICD-10-CM | POA: Diagnosis not present

## 2019-02-02 DIAGNOSIS — R790 Abnormal level of blood mineral: Secondary | ICD-10-CM | POA: Diagnosis not present

## 2019-02-02 DIAGNOSIS — R5383 Other fatigue: Secondary | ICD-10-CM | POA: Diagnosis not present

## 2019-02-02 DIAGNOSIS — R232 Flushing: Secondary | ICD-10-CM | POA: Diagnosis not present

## 2019-02-02 DIAGNOSIS — Z1339 Encounter for screening examination for other mental health and behavioral disorders: Secondary | ICD-10-CM | POA: Diagnosis not present

## 2019-02-02 DIAGNOSIS — N951 Menopausal and female climacteric states: Secondary | ICD-10-CM | POA: Diagnosis not present

## 2019-02-09 DIAGNOSIS — Z6838 Body mass index (BMI) 38.0-38.9, adult: Secondary | ICD-10-CM | POA: Diagnosis not present

## 2019-02-09 DIAGNOSIS — E559 Vitamin D deficiency, unspecified: Secondary | ICD-10-CM | POA: Diagnosis not present

## 2019-02-14 DIAGNOSIS — R350 Frequency of micturition: Secondary | ICD-10-CM | POA: Diagnosis not present

## 2019-02-14 DIAGNOSIS — R399 Unspecified symptoms and signs involving the genitourinary system: Secondary | ICD-10-CM | POA: Diagnosis not present

## 2019-02-15 ENCOUNTER — Ambulatory Visit
Admission: RE | Admit: 2019-02-15 | Discharge: 2019-02-15 | Disposition: A | Discharge status: Home / Self Care | Payer: Medicare HMO | Source: Ambulatory Visit | Attending: Family Medicine | Admitting: Family Medicine

## 2019-02-15 DIAGNOSIS — Z1231 Encounter for screening mammogram for malignant neoplasm of breast: Secondary | ICD-10-CM

## 2019-02-27 DIAGNOSIS — J45909 Unspecified asthma, uncomplicated: Secondary | ICD-10-CM | POA: Diagnosis not present

## 2019-02-28 ENCOUNTER — Emergency Department (HOSPITAL_COMMUNITY): Payer: Medicare HMO

## 2019-02-28 ENCOUNTER — Other Ambulatory Visit: Payer: Self-pay

## 2019-02-28 ENCOUNTER — Observation Stay (HOSPITAL_COMMUNITY)
Admission: EM | Admit: 2019-02-28 | Discharge: 2019-03-01 | Disposition: A | Discharge status: Home / Self Care | Payer: Medicare HMO | Attending: Internal Medicine | Admitting: Internal Medicine

## 2019-02-28 ENCOUNTER — Encounter (HOSPITAL_COMMUNITY): Payer: Self-pay | Admitting: Emergency Medicine

## 2019-02-28 DIAGNOSIS — Z79899 Other long term (current) drug therapy: Secondary | ICD-10-CM | POA: Diagnosis not present

## 2019-02-28 DIAGNOSIS — R0602 Shortness of breath: Secondary | ICD-10-CM | POA: Diagnosis not present

## 2019-02-28 DIAGNOSIS — J4541 Moderate persistent asthma with (acute) exacerbation: Secondary | ICD-10-CM | POA: Diagnosis not present

## 2019-02-28 DIAGNOSIS — J45901 Unspecified asthma with (acute) exacerbation: Secondary | ICD-10-CM | POA: Diagnosis present

## 2019-02-28 LAB — CBC WITH DIFFERENTIAL/PLATELET
Abs Immature Granulocytes: 0.01 10*3/uL (ref 0.00–0.07)
BASOS ABS: 0 10*3/uL (ref 0.0–0.1)
Basophils Relative: 1 %
EOS ABS: 0.4 10*3/uL (ref 0.0–0.5)
Eosinophils Relative: 7 %
HCT: 39.7 % (ref 36.0–46.0)
Hemoglobin: 12.6 g/dL (ref 12.0–15.0)
IMMATURE GRANULOCYTES: 0 %
Lymphocytes Relative: 35 %
Lymphs Abs: 1.8 10*3/uL (ref 0.7–4.0)
MCH: 28.1 pg (ref 26.0–34.0)
MCHC: 31.7 g/dL (ref 30.0–36.0)
MCV: 88.4 fL (ref 80.0–100.0)
Monocytes Absolute: 0.4 10*3/uL (ref 0.1–1.0)
Monocytes Relative: 8 %
Neutro Abs: 2.6 10*3/uL (ref 1.7–7.7)
Neutrophils Relative %: 49 %
Platelets: 202 10*3/uL (ref 150–400)
RBC: 4.49 MIL/uL (ref 3.87–5.11)
RDW: 13.5 % (ref 11.5–15.5)
WBC: 5.1 10*3/uL (ref 4.0–10.5)
nRBC: 0 % (ref 0.0–0.2)

## 2019-02-28 LAB — BASIC METABOLIC PANEL
Anion gap: 8 (ref 5–15)
BUN: 14 mg/dL (ref 6–20)
CALCIUM: 8.7 mg/dL — AB (ref 8.9–10.3)
CO2: 23 mmol/L (ref 22–32)
CREATININE: 0.65 mg/dL (ref 0.44–1.00)
Chloride: 106 mmol/L (ref 98–111)
GFR calc non Af Amer: >60 mL/min (ref >60)
Glucose, Bld: 102 mg/dL — ABNORMAL HIGH (ref 70–99)
Potassium: 3.6 mmol/L (ref 3.5–5.1)
Sodium: 137 mmol/L (ref 135–145)

## 2019-02-28 LAB — TROPONIN I: Troponin I: <0.03 ng/mL (ref <0.03)

## 2019-02-28 MED ORDER — BUDESONIDE 0.25 MG/2ML IN SUSP
0.2500 mg | Freq: Two times a day (BID) | RESPIRATORY_TRACT | Status: DC
  Administered 2019-02-28 – 2019-03-01 (×3): 0.25 mg via RESPIRATORY_TRACT
  Filled 2019-02-28 (×3): qty 2

## 2019-02-28 MED ORDER — SERTRALINE HCL 50 MG PO TABS
100.0000 mg | ORAL_TABLET | Freq: Every day | ORAL | Status: DC
  Filled 2019-02-28: qty 2

## 2019-02-28 MED ORDER — SODIUM CHLORIDE 0.9% FLUSH
3.0000 mL | Freq: Two times a day (BID) | INTRAVENOUS | Status: DC
  Administered 2019-02-28: 3 mL via INTRAVENOUS

## 2019-02-28 MED ORDER — BUPROPION HCL ER (XL) 150 MG PO TB24: 150.0000 mg | ORAL_TABLET | ORAL | Status: DC

## 2019-02-28 MED ORDER — IPRATROPIUM-ALBUTEROL 0.5-2.5 (3) MG/3ML IN SOLN
3.0000 mL | Freq: Once | RESPIRATORY_TRACT | Status: AC
  Administered 2019-02-28: 3 mL via RESPIRATORY_TRACT
  Filled 2019-02-28: qty 3

## 2019-02-28 MED ORDER — ENOXAPARIN SODIUM 40 MG/0.4ML ~~LOC~~ SOLN: 40.0000 mg | SUBCUTANEOUS | Status: DC

## 2019-02-28 MED ORDER — ONDANSETRON HCL 4 MG PO TABS
4.0000 mg | ORAL_TABLET | Freq: Four times a day (QID) | ORAL | Status: DC | PRN
  Administered 2019-02-28: 4 mg via ORAL
  Filled 2019-02-28: qty 1

## 2019-02-28 MED ORDER — SERTRALINE HCL 50 MG PO TABS
100.0000 mg | ORAL_TABLET | Freq: Every day | ORAL | Status: DC
  Administered 2019-02-28: 100 mg via ORAL
  Filled 2019-02-28: qty 2

## 2019-02-28 MED ORDER — ONDANSETRON HCL 4 MG/2ML IJ SOLN: 4.0000 mg | Freq: Four times a day (QID) | INTRAMUSCULAR | Status: DC | PRN

## 2019-02-28 MED ORDER — IPRATROPIUM-ALBUTEROL 0.5-2.5 (3) MG/3ML IN SOLN
3.0000 mL | Freq: Four times a day (QID) | RESPIRATORY_TRACT | Status: DC
  Administered 2019-02-28 – 2019-03-01 (×4): 3 mL via RESPIRATORY_TRACT
  Filled 2019-02-28 (×4): qty 3

## 2019-02-28 MED ORDER — ALBUTEROL SULFATE (2.5 MG/3ML) 0.083% IN NEBU
2.5000 mg | INHALATION_SOLUTION | Freq: Once | RESPIRATORY_TRACT | Status: AC
  Administered 2019-02-28: 2.5 mg via RESPIRATORY_TRACT
  Filled 2019-02-28: qty 3

## 2019-02-28 MED ORDER — CALCIUM CARBONATE 600 MG PO TABS: 600.0000 mg | ORAL_TABLET | Freq: Every day | ORAL | Status: DC

## 2019-02-28 MED ORDER — CITALOPRAM HYDROBROMIDE 20 MG PO TABS: 20.0000 mg | ORAL_TABLET | Freq: Every day | ORAL | Status: DC

## 2019-02-28 MED ORDER — ACETAMINOPHEN 650 MG RE SUPP: 650.0000 mg | Freq: Four times a day (QID) | RECTAL | Status: DC | PRN

## 2019-02-28 MED ORDER — METHYLPREDNISOLONE SODIUM SUCC 125 MG IJ SOLR
125.0000 mg | Freq: Once | INTRAMUSCULAR | Status: AC
  Administered 2019-02-28: 125 mg via INTRAVENOUS
  Filled 2019-02-28: qty 2

## 2019-02-28 MED ORDER — METHYLPREDNISOLONE SODIUM SUCC 40 MG IJ SOLR
40.0000 mg | Freq: Two times a day (BID) | INTRAMUSCULAR | Status: DC
  Administered 2019-02-28 – 2019-03-01 (×2): 40 mg via INTRAVENOUS
  Filled 2019-02-28 (×2): qty 1

## 2019-02-28 MED ORDER — SODIUM CHLORIDE 0.9 % IV SOLN: 250.0000 mL | INTRAVENOUS | Status: DC | PRN

## 2019-02-28 MED ORDER — SODIUM CHLORIDE 0.9% FLUSH: 3.0000 mL | INTRAVENOUS | Status: DC | PRN

## 2019-02-28 MED ORDER — VITAMIN D 1000 UNITS PO TABS: 1000.0000 [IU] | ORAL_TABLET | Freq: Every day | ORAL | Status: DC

## 2019-02-28 MED ORDER — ALBUTEROL (5 MG/ML) CONTINUOUS INHALATION SOLN
10.0000 mg/h | INHALATION_SOLUTION | Freq: Once | RESPIRATORY_TRACT | Status: AC
  Administered 2019-02-28: 10 mg/h via RESPIRATORY_TRACT
  Filled 2019-02-28: qty 20

## 2019-02-28 MED ORDER — ENOXAPARIN SODIUM 60 MG/0.6ML ~~LOC~~ SOLN: 60.0000 mg | SUBCUTANEOUS | Status: DC

## 2019-02-28 MED ORDER — ACETAMINOPHEN 325 MG PO TABS: 650.0000 mg | ORAL_TABLET | Freq: Four times a day (QID) | ORAL | Status: DC | PRN

## 2019-02-28 NOTE — ED Notes (Addendum)
Notified respiratory of breathing treatments.

## 2019-02-28 NOTE — ED Provider Notes (Signed)
[facility] EMERGENCY DEPARTMENT Provider Note   CSN: [id number] Arrival date & time: 02/28/19  0840    History   Chief Complaint Chief Complaint  Patient presents with  . Shortness of Breath    HPI KATYA ROLSTON is a 48 y.o. female.     HPI  MAHAYLA HADDAWAY is a 48 y.o. female who presents to the Emergency Department complaining of increasing chest tightness, shortness of breath, and wheezing.  Symptoms have been present for 3 days.  She states she has been staying with her mother who is currently hospitalized and she has been in and out of the hospital.  Wilburn Mylar, she was seen at a local urgent care and given albuterol nebulizer treatment and started on azithromycin.  She is also been using albuterol inhaler for 2 days.  She states this does not seem to be helping her symptoms.  She complains of audible wheezing that is worse with lying down.  She also describes a fullness or tightness from her upper abdomen up through the middle of her chest into her left neck.  She also states that she feels like she cannot expel her air properly.  She denies fever, nasal congestion, sore throat, cough, abdominal pain, and vomiting.  She reports a history of asthma as a child but does not currently use albuterol on a daily basis.  She is a non-smoker.  No recent travel, birth control and no history of PE or DVT.   Past Medical History:  Diagnosis Date  . ADHD (attention deficit hyperactivity disorder)   . Anxiety    ptsd  . Depression    with psychosis, bipolar, ect therapy  . Goiter    multinodualr  . Herniated disc, cervical 04/2017   Has 2 herniated discs  . OCD (obsessive compulsive disorder)   . Suicidal behavior     Patient Active Problem List   Diagnosis Date Noted  . ADHD, predominantly inattentive type 09/05/2017  . ACUTE SINUSITIS, UNSPECIFIED 02/08/2009  . ANXIETY DISORDER, GENERALIZED 09/08/2008  . HYPERTHYROIDISM 08/21/2008  . PALPITATIONS 08/21/2008  . CHEST PAIN  08/21/2008  . COCCYGEAL PAIN 12/29/2007  . CYSTITIS, ACUTE 10/06/2007  . GOITER NOS 09/01/2007  . ANEMIA-NOS 09/01/2007  . DEPRESSION 09/01/2007  . ASTHMA 09/01/2007  . SYNCOPE 09/01/2007    Past Surgical History:  Procedure Laterality Date  . ABDOMINAL HYSTERECTOMY    . CHOLECYSTECTOMY    . CRYOTHERAPY     cervix  . TUBAL LIGATION       OB History   No obstetric history on file.      Home Medications    Prior to Admission medications   Medication Sig Start Date End Date Taking? Authorizing Provider  buPROPion (WELLBUTRIN XL) 150 MG 24 hr tablet Take 1 tablet (150 mg total) by mouth every morning. 05/18/18 05/18/19  Eksir, Richard Miu, MD  calcium carbonate (OS-CAL) 600 MG TABS Take 600 mg by mouth daily.      [provider]  cholecalciferol (VITAMIN D) 1000 UNITS tablet Take 1,000 Units by mouth daily.      [provider]  citalopram (CELEXA) 20 MG tablet Take 1 tablet (20 mg total) by mouth daily. 05/18/18 05/18/19  Aundra Dubin, MD  FOLIC ACID PO Take by mouth daily. Take 2 mg     [provider]    Family History Family History  Problem Relation Age of Onset  . Arthritis Other   . Diabetes Other   . ADD /  ADHD Other   . Schizophrenia Father   . Drug abuse Father   . Bipolar disorder Mother   . Depression Mother   . Anxiety disorder Sister   . Anxiety disorder Brother   . Physical abuse Brother   . OCD Other   . Anxiety disorder Other     Social History Social History   Tobacco Use  . Smoking status: Never Smoker  . Smokeless tobacco: Never Used  Substance Use Topics  . Alcohol use: No  . Drug use: No     Allergies   Ceftriaxone sodium and Lorazepam   Review of Systems Review of Systems  Constitutional: Negative for appetite change, chills and fever.  HENT: Negative for congestion, rhinorrhea, sneezing, sore throat and trouble swallowing.   Respiratory: Positive for chest tightness, shortness of breath  and wheezing. Negative for cough.   Cardiovascular: Positive for chest pain. Negative for palpitations and leg swelling.  Gastrointestinal: Negative for abdominal pain, nausea and vomiting.  Genitourinary: Negative for decreased urine volume and dysuria.  Musculoskeletal: Positive for neck pain. Negative for arthralgias and neck stiffness.  Skin: Negative for rash.  Neurological: Negative for dizziness, weakness, numbness and headaches.  Hematological: Negative for adenopathy.     Physical Exam Updated Vital Signs Pulse 76   Temp 98.6 F (37 C)   Resp 13   Ht 5\' 8"  (1.727 m)   Wt 116.1 kg   SpO2 100%   BMI 38.92 kg/m   Physical Exam Vitals signs and nursing note reviewed.  Constitutional:      Appearance: She is not toxic-appearing.     Comments: Patient appears anxious and uncomfortable.  HENT:     Head: Atraumatic.     Right Ear: Tympanic membrane and ear canal normal.     Left Ear: Tympanic membrane and ear canal normal.     Nose: No congestion.     Mouth/Throat:     Mouth: Mucous membranes are moist.     Pharynx: Oropharynx is clear.  Neck:     Musculoskeletal: Normal range of motion and neck supple. No neck rigidity.     Thyroid: No thyromegaly.     Vascular: No carotid bruit or JVD.  Cardiovascular:     Rate and Rhythm: Normal rate and regular rhythm.     Pulses: Normal pulses.  Pulmonary:     Breath sounds: Wheezing present. No rales.     Comments: Audible expiratory wheezes throughout.  No rales.  Patient is able to speak in full and complete sentences, but somewhat labored. Abdominal:     General: There is no distension.     Palpations: Abdomen is soft.     Tenderness: There is no abdominal tenderness. There is no guarding.  Musculoskeletal:        General: No swelling.     Right lower leg: No edema.     Left lower leg: No edema.  Lymphadenopathy:     Cervical: No cervical adenopathy.  Skin:    General: Skin is warm.     Capillary Refill: Capillary  refill takes less than 2 seconds.     Findings: No rash.  Neurological:     General: No focal deficit present.     Mental Status: She is alert.     Sensory: No sensory deficit.      ED Treatments / Results  Labs (all labs ordered are listed, but only abnormal results are displayed) Labs Reviewed  BASIC METABOLIC PANEL - Abnormal; Notable for  the following components:      Result Value   Glucose, Bld 102 (*)    Calcium 8.7 (*)    All other components within normal limits  CBC WITH DIFFERENTIAL/PLATELET  TROPONIN I    EKG EKG Interpretation  Date/Time:  Sunday February 28 2019 09:00:13 EST Ventricular Rate:  65 PR Interval:    QRS Duration: 111 QT Interval:  399 QTC Calculation: 415 R Axis:   27 Text Interpretation:  Sinus rhythm Anteroseptal infarct, age indeterminate Confirmed by Davonna Belling [phone]) on 02/28/2019 9:58:28 AM   Radiology Dg Chest 2 View  Result Date: 02/28/2019 CLINICAL DATA:  Shortness of breath and wheezing, initial encounter EXAM: CHEST - 2 VIEW COMPARISON:  10/26/2018 FINDINGS: The heart size and mediastinal contours are within normal limits. Both lungs are clear. The visualized skeletal structures are unremarkable. IMPRESSION: No active cardiopulmonary disease. Electronically Signed   By: Inez Catalina M.D.   On: 02/28/2019 10:21    Procedures Procedures (including critical care time)  Medications Ordered in ED Medications  ipratropium-albuterol (DUONEB) 0.5-2.5 (3) MG/3ML nebulizer solution 3 mL (has no administration in time range)  albuterol (PROVENTIL) (2.5 MG/3ML) 0.083% nebulizer solution 2.5 mg (has no administration in time range)  methylPREDNISolone sodium succinate (SOLU-MEDROL) 125 mg/2 mL injection 125 mg (has no administration in time range)     Initial Impression / Assessment and Plan / ED Course  I have reviewed the triage vital signs and the nursing notes.  Pertinent labs & imaging results that were available during my care of  the patient were reviewed by me and considered in my medical decision making (see chart for details).    CRITICAL CARE Performed by: Riese Hellard  ?  Total critical care time: 30 minutes  Critical care time was exclusive of separately billable procedures and treating other patients.  Critical care was necessary to treat or prevent imminent or life-threatening deterioration.  Critical care was time spent personally by me on the following activities: development of treatment plan with patient and/or surrogate as well as nursing, discussions with consultants, evaluation of patient's response to treatment, examination of patient, obtaining history from patient or surrogate, ordering and performing treatments and interventions, ordering and review of laboratory studies, ordering and review of radiographic studies, pulse oximetry and re-evaluation of patient's condition.     Patient with audible expiratory wheezes and diminished breath sounds bilaterally.  No hypoxia. Wells PE scoring is low for PE.  History of childhood asthma, non-smoker.  Minimal improvement after 5 mg albuterol/Atrovent neb.  will order continuous albuterol and reassess  1110 on recheck, lung sounds have slightly improved.  Audible wheezing remains present, labs and chest x-ray are reassuring.  1140 patient continues to have labored breathing and expiratory wheezes despite steroids and continuous albuterol neb.  Will consult hospitalist for admission.  Discussed care plan with my attending, Dr. Alvino Chapel.    [place] hospitalist, Dr. Manuella Ghazi, who agrees to admit pt.    Final Clinical Impressions(s) / ED Diagnoses   Final diagnoses:  Moderate persistent asthma with exacerbation    ED Discharge Orders    None       Kem Parkinson, PA-C 02/28/19 1247    Davonna Belling, MD 02/28/19 1451

## 2019-02-28 NOTE — H&P (Signed)
History and Physical    [name] [id number] DOB: 19-Sep-1971 DOA: 02/28/2019  PCP: [facility], [place]   Patient coming from: Home  Chief Complaint: Dyspnea/wheezing  HPI: [name] is a 48 y.o. female with medical history significant for asthma as a child and anxiety/depression who presented to the ED with worsening chest tightness, shortness of breath and wheezing.  She was recently seen at urgent care, just yesterday and was given an albuterol nebulizer treatment and started on azithromycin with no improvement noted.  She was also given an inhaler that she was using with no relief of symptoms.  She continues to have audible wheezing that is worse with exertion and continues to have chest tightness.  She denies any cough, fevers, or chills and has received treatment in the ED with no improvement noted.  She states that she has been in and out of the hospital quite frequently recently due to her mother-in-law having a rupture of her abdominal aorta.  She is a non-smoker, but has had exposure to secondhand smoke as a child.   ED Course: Vital signs are stable and patient is on room air without any significant hypoxemia.  Laboratory values are within normal limits with no acute findings.  Two-view chest x-ray with no acute findings.  EKG with sinus rhythm at 65 bpm.  She has received nebulizer treatments as well as steroids with minimal relief noted thus far.  Review of Systems: All others reviewed and otherwise negative.  Past Medical History:  Diagnosis Date  . ADHD (attention deficit hyperactivity disorder)   . Anxiety    ptsd  . Depression    with psychosis, bipolar, ect therapy  . Goiter    multinodualr  . Herniated disc, cervical 04/2017   Has 2 herniated discs  . OCD (obsessive compulsive disorder)   . Suicidal behavior     Past Surgical History:  Procedure Laterality Date  . ABDOMINAL HYSTERECTOMY    . CHOLECYSTECTOMY    . CRYOTHERAPY     cervix  . TUBAL LIGATION       reports that she has never smoked. She has never used smokeless tobacco. She reports that she does not drink alcohol or use drugs.  Allergies  Allergen Reactions  . Ceftriaxone Sodium     REACTION: unspecified  . Lorazepam     REACTION: unspecified  . Prednisone     Family History  Problem Relation Age of Onset  . Arthritis Other   . Diabetes Other   . ADD / ADHD Other   . Schizophrenia Father   . Drug abuse Father   . Bipolar disorder Mother   . Depression Mother   . Anxiety disorder Sister   . Anxiety disorder Brother   . Physical abuse Brother   . OCD Other   . Anxiety disorder Other     Prior to Admission medications   Medication Sig Start Date End Date Taking? Authorizing Provider  albuterol (PROVENTIL HFA;VENTOLIN HFA) 108 (90 Base) MCG/ACT inhaler Inhale 2 puffs into the lungs every 4 (four) hours as needed for wheezing or shortness of breath.  01/09/19  Yes [provider]  azithromycin (ZITHROMAX) 250 MG tablet Take 250 mg by mouth See admin instructions. Take 500 mg on day one and 250 mg daily for 4 days.   Yes [provider]  propranolol (INDERAL) 10 MG tablet Take 10 mg by mouth 3 (three) times daily as needed.  12/11/18  Yes [provider]  sertraline (ZOLOFT) 100 MG tablet Take 100 mg by mouth daily. 02/01/19  Yes [provider]  buPROPion (WELLBUTRIN XL) 150 MG 24 hr tablet Take 1 tablet (150 mg total) by mouth every morning. Patient not taking: Reported on 02/28/2019 05/18/18 05/18/19  Aundra Dubin, MD  calcium carbonate (OS-CAL) 600 MG TABS Take 600 mg by mouth daily.      [provider]  cholecalciferol (VITAMIN D) 1000 UNITS tablet Take 1,000 Units by mouth daily.      [provider]  citalopram (CELEXA) 20 MG tablet Take 1 tablet (20 mg total) by mouth daily. Patient not taking: Reported on 02/28/2019 05/18/18 05/18/19  Aundra Dubin, MD  FOLIC ACID PO Take by  mouth daily. Take 2 mg     [provider]    Physical Exam: Vitals:   02/28/19 0948 02/28/19 1000 02/28/19 1001 02/28/19 1200  BP:  (!) 140/128    Pulse:  69  (!) 106  Resp:  12  15  Temp:      SpO2: 100% 100% 100% 99%  Weight:      Height:        Constitutional: NAD, calm, comfortable Vitals:   02/28/19 0948 02/28/19 1000 02/28/19 1001 02/28/19 1200  BP:  (!) 140/128    Pulse:  69  (!) 106  Resp:  12  15  Temp:      SpO2: 100% 100% 100% 99%  Weight:      Height:       Eyes: lids and conjunctivae normal ENMT: Mucous membranes are moist.  Neck: normal, supple Respiratory: Ongoing wheezing noted bilaterally. Normal respiratory effort. No accessory muscle use. Currently on RA. Cardiovascular: Regular rate and rhythm, no murmurs. No extremity edema. Abdomen: no tenderness, no distention. Bowel sounds positive.  Musculoskeletal:  No joint deformity upper and lower extremities.   Skin: no rashes, lesions, ulcers.  Psychiatric: Normal judgment and insight. Alert and oriented x 3. Normal mood.   Labs on Admission: I have personally reviewed following labs and imaging studies  CBC: Recent Labs  Lab 02/28/19 0920  WBC 5.1  NEUTROABS 2.6  HGB 12.6  HCT 39.7  MCV 88.4  PLT 588   Basic Metabolic Panel: Recent Labs  Lab 02/28/19 0920  NA 137  K 3.6  CL 106  CO2 23  GLUCOSE 102*  BUN 14  CREATININE 0.65  CALCIUM 8.7*   GFR: Estimated Creatinine Clearance: 112.6 mL/min (by C-G formula based on SCr of 0.65 mg/dL). Liver Function Tests: No results for input(s): AST, ALT, ALKPHOS, BILITOT, PROT, ALBUMIN in the last 168 hours. No results for input(s): LIPASE, AMYLASE in the last 168 hours. No results for input(s): AMMONIA in the last 168 hours. Coagulation Profile: No results for input(s): INR, PROTIME in the last 168 hours. Cardiac Enzymes: Recent Labs  Lab 02/28/19 0920  TROPONINI <0.03   BNP (last 3 results) No results for input(s): PROBNP in the  last 8760 hours. HbA1C: No results for input(s): HGBA1C in the last 72 hours. CBG: No results for input(s): GLUCAP in the last 168 hours. Lipid Profile: No results for input(s): CHOL, HDL, LDLCALC, TRIG, CHOLHDL, LDLDIRECT in the last 72 hours. Thyroid Function Tests: No results for input(s): TSH, T4TOTAL, FREET4, T3FREE, THYROIDAB in the last 72 hours. Anemia Panel: No results for input(s): VITAMINB12, FOLATE, FERRITIN, TIBC, IRON, RETICCTPCT in the last 72 hours. Urine analysis:    Component Value Date/Time   COLORURINE yellow 10/06/2007 1012  APPEARANCEUR Clear 10/06/2007 1012   LABSPEC >=1.030 10/06/2007 1012   PHURINE 6.0 10/06/2007 1012   HGBUR negative 10/06/2007 1012   BILIRUBINUR negative 10/06/2007 1012   UROBILINOGEN 0.2 10/06/2007 1012   NITRITE negative 10/06/2007 1012    Radiological Exams on Admission: Dg Chest 2 View  Result Date: 02/28/2019 CLINICAL DATA:  Shortness of breath and wheezing, initial encounter EXAM: CHEST - 2 VIEW COMPARISON:  10/26/2018 FINDINGS: The heart size and mediastinal contours are within normal limits. Both lungs are clear. The visualized skeletal structures are unremarkable. IMPRESSION: No active cardiopulmonary disease. Electronically Signed   By: Inez Catalina M.D.   On: 02/28/2019 10:21    EKG: Independently reviewed. SR 65bpm.  Assessment/Plan Principal Problem:   Asthma exacerbation    1. Acute asthma exacerbation.  Continue on breathing treatments as well as IV steroids and monitor for improvement.  Patient has no oxygen requirements at this time.  No need for antibiotics noted at this time.  Non-smoker. 2. Anxiety/depression.  Continue home medications as previously prescribed.   DVT prophylaxis: Lovenox Code Status: Full code Family Communication: Friend at bedside Disposition Plan: Admit for treatment of asthma exacerbation Consults called: None Admission status: Observation, MedSurg   Shivansh Hardaway Darleen Crocker DO [facility] Pager [phone]  If 7PM-7AM, please contact night-coverage [email or web address] Password [facility]  02/28/2019, 12:33 PM

## 2019-02-28 NOTE — ED Notes (Addendum)
Patient reports she is nauseous and shaky after breathing treatment. Gave 4 mg PO Zofran.

## 2019-02-28 NOTE — ED Notes (Signed)
Notified respiratory of breathing treatments.

## 2019-02-28 NOTE — ED Triage Notes (Signed)
Pt c/o of sob, wheezing and neck pain x 3 days. Saw urgent care yesterday and was placed on ABX and albuterol at home with no relief.

## 2019-03-01 DIAGNOSIS — J4541 Moderate persistent asthma with (acute) exacerbation: Secondary | ICD-10-CM | POA: Diagnosis not present

## 2019-03-01 MED ORDER — IPRATROPIUM-ALBUTEROL 0.5-2.5 (3) MG/3ML IN SOLN: 3.0000 mL | Freq: Two times a day (BID) | RESPIRATORY_TRACT | Status: DC

## 2019-03-01 MED ORDER — PREDNISONE 20 MG PO TABS: 40.0000 mg | ORAL_TABLET | Freq: Every day | ORAL | 0 refills | Status: AC

## 2019-03-01 MED ORDER — ALBUTEROL SULFATE HFA 108 (90 BASE) MCG/ACT IN AERS: 2.0000 | INHALATION_SPRAY | RESPIRATORY_TRACT | 3 refills | Status: DC | PRN

## 2019-03-01 NOTE — Discharge Summary (Signed)
Physician Discharge Summary  [name] [id number] DOB: 1971-01-27 DOA: 02/28/2019  PCP: [facility]  Admit date: 02/28/2019  Discharge date: 03/01/2019  Admitted From:Home  Disposition:  Home  Recommendations for Outpatient Follow-up:  1. Follow up with PCP in 1-2 weeks 2. Continue on rescue inhaler as needed for shortness of breath or wheezing 3. Finish course of prednisone over the next 5 days  Home Health: None  Equipment/Devices: None  Discharge Condition: Stable  CODE STATUS: Full  Diet recommendation: Regular  Brief/Interim Summary: Per HPI:  [name] is a 48 y.o. female with medical history significant for asthma as a child and anxiety/depression who presented to the ED with worsening chest tightness, shortness of breath and wheezing.  She was recently seen at urgent care, just yesterday and was given an albuterol nebulizer treatment and started on azithromycin with no improvement noted.  She was also given an inhaler that she was using with no relief of symptoms.  She continues to have audible wheezing that is worse with exertion and continues to have chest tightness.  She denies any cough, fevers, or chills and has received treatment in the ED with no improvement noted.  She states that she has been in and out of the hospital quite frequently recently due to her mother-in-law having a rupture of her abdominal aorta.  She is a non-smoker, but has had exposure to secondhand smoke as a child.  Patient was admitted for an acute asthma exacerbation and has improved considerably overnight with use of scheduled breathing treatments as well as steroids.  She denies any further symptoms of shortness of breath, coughing, wheezing, or chest tightness.  She will remain on home inhaler as needed for shortness of breath or wheezing and will complete a 5-day course of prednisone as well.  No other acute events noted during this admission.  Discharge  Diagnoses:  Principal Problem:   Asthma exacerbation  Principal discharge diagnosis: Acute asthma exacerbation.  Discharge Instructions  Discharge Instructions    Diet - low sodium heart healthy   Complete by:  As directed    Increase activity slowly   Complete by:  As directed      Allergies as of 03/01/2019      Reactions   Ceftriaxone Sodium    REACTION: unspecified   Lorazepam    REACTION: unspecified   Prednisone       Medication List    STOP taking these medications   azithromycin 250 MG tablet Commonly known as:  ZITHROMAX   buPROPion 150 MG 24 hr tablet Commonly known as:  WELLBUTRIN XL   citalopram 20 MG tablet Commonly known as:  CELEXA     TAKE these medications   albuterol 108 (90 Base) MCG/ACT inhaler Commonly known as:  PROVENTIL HFA;VENTOLIN HFA Inhale 2 puffs into the lungs every 4 (four) hours as needed for up to 30 days for wheezing or shortness of breath.   calcium carbonate 600 MG Tabs tablet Commonly known as:  OS-CAL Take 600 mg by mouth daily.   cholecalciferol 1000 units tablet Commonly known as:  VITAMIN D Take 1,000 Units by mouth daily.   FOLIC ACID PO Take by mouth daily. Take 2 mg   predniSONE 20 MG tablet Commonly known as:  DELTASONE Take 2 tablets (40 mg total) by mouth daily for 5 days.   propranolol 10 MG tablet Commonly known as:  INDERAL Take 10 mg by mouth 3 (three) times daily as needed.  sertraline 100 MG tablet Commonly known as:  ZOLOFT Take 100 mg by mouth daily.      Follow-up Information    [facility] Follow up in 1 week(s).   Specialty:  Family Medicine Contact information: [address] [place] [address] [phone]          Allergies  Allergen Reactions  . Ceftriaxone Sodium     REACTION: unspecified  . Lorazepam     REACTION: unspecified  . Prednisone     Consultations:  None   Procedures/Studies: Dg Chest 2 View  Result Date:  02/28/2019 CLINICAL DATA:  Shortness of breath and wheezing, initial encounter EXAM: CHEST - 2 VIEW COMPARISON:  10/26/2018 FINDINGS: The heart size and mediastinal contours are within normal limits. Both lungs are clear. The visualized skeletal structures are unremarkable. IMPRESSION: No active cardiopulmonary disease. Electronically Signed   By: Inez Catalina M.D.   On: 02/28/2019 10:21   Mm 3d Screen Breast Bilateral  Result Date: 02/17/2019 CLINICAL DATA:  Screening. EXAM: DIGITAL SCREENING BILATERAL MAMMOGRAM WITH TOMO AND CAD COMPARISON:  Previous exam(s). ACR Breast Density Category b: There are scattered areas of fibroglandular density. FINDINGS: There are no findings suspicious for malignancy. Images were processed with CAD. IMPRESSION: No mammographic evidence of malignancy. A result letter of this screening mammogram will be mailed directly to the patient. RECOMMENDATION: Screening mammogram in one year. (Code:SM-B-01Y) BI-RADS CATEGORY  1: Negative. Electronically Signed   By: Claudie Revering M.D.   On: 02/17/2019 07:45     Discharge Exam: Vitals:   03/01/19 0550 03/01/19 0724  BP: (!) 120/49   Pulse: 64   Resp: 20   Temp: 97.8 F (36.6 C)   SpO2: 99% 99%   Vitals:   02/28/19 2123 02/28/19 2148 03/01/19 0550 03/01/19 0724  BP: (!) 158/87  (!) 120/49   Pulse: 79 (!) 105 64   Resp: 20  20   Temp: 98.2 F (36.8 C)  97.8 F (36.6 C)   TempSrc: Oral  Oral   SpO2: 95% 96% 99% 99%  Weight:  114.1 kg    Height:  5\' 8"  (1.727 m)      General: Pt is alert, awake, not in acute distress Cardiovascular: RRR, S1/S2 +, no rubs, no gallops Respiratory: CTA bilaterally, no wheezing, no rhonchi Abdominal: Soft, NT, ND, bowel sounds + Extremities: no edema, no cyanosis    The results of significant diagnostics from this hospitalization (including imaging, microbiology, ancillary and laboratory) are listed below for reference.     Microbiology: No results found for this or any previous  visit (from the past 240 hour(s)).   Labs: BNP (last 3 results) No results for input(s): BNP in the last 8760 hours. Basic Metabolic Panel: Recent Labs  Lab 02/28/19 0920  NA 137  K 3.6  CL 106  CO2 23  GLUCOSE 102*  BUN 14  CREATININE 0.65  CALCIUM 8.7*   Liver Function Tests: No results for input(s): AST, ALT, ALKPHOS, BILITOT, PROT, ALBUMIN in the last 168 hours. No results for input(s): LIPASE, AMYLASE in the last 168 hours. No results for input(s): AMMONIA in the last 168 hours. CBC: Recent Labs  Lab 02/28/19 0920  WBC 5.1  NEUTROABS 2.6  HGB 12.6  HCT 39.7  MCV 88.4  PLT 202   Cardiac Enzymes: Recent Labs  Lab 02/28/19 0920  TROPONINI <0.03   BNP: Invalid input(s): POCBNP CBG: No results for input(s): GLUCAP in the last  168 hours. D-Dimer No results for input(s): DDIMER in the last 72 hours. Hgb A1c No results for input(s): HGBA1C in the last 72 hours. Lipid Profile No results for input(s): CHOL, HDL, LDLCALC, TRIG, CHOLHDL, LDLDIRECT in the last 72 hours. Thyroid function studies No results for input(s): TSH, T4TOTAL, T3FREE, THYROIDAB in the last 72 hours.  Invalid input(s): FREET3 Anemia work up No results for input(s): VITAMINB12, FOLATE, FERRITIN, TIBC, IRON, RETICCTPCT in the last 72 hours. Urinalysis    Component Value Date/Time   COLORURINE yellow 10/06/2007 1012   APPEARANCEUR Clear 10/06/2007 1012   LABSPEC >=1.030 10/06/2007 1012   PHURINE 6.0 10/06/2007 1012   HGBUR negative 10/06/2007 1012   BILIRUBINUR negative 10/06/2007 1012   UROBILINOGEN 0.2 10/06/2007 1012   NITRITE negative 10/06/2007 1012   Sepsis Labs Invalid input(s): PROCALCITONIN,  WBC,  LACTICIDVEN Microbiology No results found for this or any previous visit (from the past 240 hour(s)).   Time coordinating discharge: 35 minutes  SIGNED:   Rodena Goldmann, DO [facility] 03/01/2019, 9:21 AM  If 7PM-7AM, please contact  night-coverage [email or web address] Password [email or web address]

## 2019-03-01 NOTE — Progress Notes (Signed)
Nsg Discharge Note  Admit Date:  02/28/2019 Discharge date: 03/01/2019   SKYLEN DANIELSEN to be D/C'd home per MD order.  AVS completed.  Copy for chart, and copy for patient signed, and dated. Patient/caregiver able to verbalize understanding.  Discharge Medication: Allergies as of 03/01/2019      Reactions   Ceftriaxone Sodium    REACTION: unspecified   Lorazepam    REACTION: unspecified   Prednisone       Medication List    STOP taking these medications   azithromycin 250 MG tablet Commonly known as:  ZITHROMAX   buPROPion 150 MG 24 hr tablet Commonly known as:  WELLBUTRIN XL   citalopram 20 MG tablet Commonly known as:  CELEXA     TAKE these medications   albuterol 108 (90 Base) MCG/ACT inhaler Commonly known as:  PROVENTIL HFA;VENTOLIN HFA Inhale 2 puffs into the lungs every 4 (four) hours as needed for up to 30 days for wheezing or shortness of breath.   calcium carbonate 600 MG Tabs tablet Commonly known as:  OS-CAL Take 600 mg by mouth daily.   cholecalciferol 1000 units tablet Commonly known as:  VITAMIN D Take 1,000 Units by mouth daily.   FOLIC ACID PO Take by mouth daily. Take 2 mg   predniSONE 20 MG tablet Commonly known as:  DELTASONE Take 2 tablets (40 mg total) by mouth daily for 5 days.   propranolol 10 MG tablet Commonly known as:  INDERAL Take 10 mg by mouth 3 (three) times daily as needed.   sertraline 100 MG tablet Commonly known as:  ZOLOFT Take 100 mg by mouth daily.       Discharge Assessment: Vitals:   03/01/19 0550 03/01/19 0724  BP: (!) 120/49   Pulse: 64   Resp: 20   Temp: 97.8 F (36.6 C)   SpO2: 99% 99%   Skin clean, dry and intact without evidence of skin break down, no evidence of skin tears noted. IV catheter discontinued intact. Site without signs and symptoms of complications - no redness or edema noted at insertion site, patient denies c/o pain - only slight tenderness at site.  Dressing with slight pressure  applied.  D/c Instructions-Education: Discharge instructions given to patient/family with verbalized understanding. D/c education completed with patient/family including follow up instructions, medication list, d/c activities limitations if indicated, with other d/c instructions as indicated by MD - patient able to verbalize understanding, all questions fully answered. Patient instructed to return to ED, call 911, or call MD for any changes in condition.  Patient escorted via [place], and D/C home via private auto.  Loa Socks, RN 03/01/2019 9:40 AM

## 2019-03-02 LAB — HIV ANTIBODY (ROUTINE TESTING W REFLEX): HIV SCREEN 4TH GENERATION: NONREACTIVE

## 2019-03-04 DIAGNOSIS — J45909 Unspecified asthma, uncomplicated: Secondary | ICD-10-CM | POA: Diagnosis not present

## 2019-03-04 DIAGNOSIS — R69 Illness, unspecified: Secondary | ICD-10-CM | POA: Diagnosis not present

## 2019-03-05 DIAGNOSIS — Z6837 Body mass index (BMI) 37.0-37.9, adult: Secondary | ICD-10-CM | POA: Diagnosis not present

## 2019-03-05 DIAGNOSIS — R790 Abnormal level of blood mineral: Secondary | ICD-10-CM | POA: Diagnosis not present

## 2019-03-10 DIAGNOSIS — K5900 Constipation, unspecified: Secondary | ICD-10-CM | POA: Diagnosis not present

## 2019-03-10 DIAGNOSIS — E559 Vitamin D deficiency, unspecified: Secondary | ICD-10-CM | POA: Diagnosis not present

## 2019-03-10 DIAGNOSIS — E611 Iron deficiency: Secondary | ICD-10-CM | POA: Diagnosis not present

## 2019-03-10 DIAGNOSIS — Z6837 Body mass index (BMI) 37.0-37.9, adult: Secondary | ICD-10-CM | POA: Diagnosis not present

## 2019-03-18 DIAGNOSIS — Z6836 Body mass index (BMI) 36.0-36.9, adult: Secondary | ICD-10-CM | POA: Diagnosis not present

## 2019-03-18 DIAGNOSIS — E559 Vitamin D deficiency, unspecified: Secondary | ICD-10-CM | POA: Diagnosis not present

## 2019-03-25 DIAGNOSIS — R5383 Other fatigue: Secondary | ICD-10-CM | POA: Diagnosis not present

## 2019-03-25 DIAGNOSIS — Z6836 Body mass index (BMI) 36.0-36.9, adult: Secondary | ICD-10-CM | POA: Diagnosis not present

## 2019-03-25 DIAGNOSIS — E559 Vitamin D deficiency, unspecified: Secondary | ICD-10-CM | POA: Diagnosis not present

## 2019-04-01 DIAGNOSIS — Z6836 Body mass index (BMI) 36.0-36.9, adult: Secondary | ICD-10-CM | POA: Diagnosis not present

## 2019-04-01 DIAGNOSIS — E559 Vitamin D deficiency, unspecified: Secondary | ICD-10-CM | POA: Diagnosis not present

## 2019-04-02 DIAGNOSIS — J301 Allergic rhinitis due to pollen: Secondary | ICD-10-CM | POA: Diagnosis not present

## 2019-04-02 DIAGNOSIS — R69 Illness, unspecified: Secondary | ICD-10-CM | POA: Diagnosis not present

## 2019-04-02 DIAGNOSIS — J3089 Other allergic rhinitis: Secondary | ICD-10-CM | POA: Diagnosis not present

## 2019-04-02 DIAGNOSIS — H1045 Other chronic allergic conjunctivitis: Secondary | ICD-10-CM | POA: Diagnosis not present

## 2019-04-02 DIAGNOSIS — J4540 Moderate persistent asthma, uncomplicated: Secondary | ICD-10-CM | POA: Diagnosis not present

## 2019-04-08 DIAGNOSIS — E611 Iron deficiency: Secondary | ICD-10-CM | POA: Diagnosis not present

## 2019-04-08 DIAGNOSIS — Z6836 Body mass index (BMI) 36.0-36.9, adult: Secondary | ICD-10-CM | POA: Diagnosis not present

## 2019-05-11 DIAGNOSIS — F410 Panic disorder [episodic paroxysmal anxiety] without agoraphobia: Secondary | ICD-10-CM | POA: Diagnosis not present

## 2019-05-11 DIAGNOSIS — R69 Illness, unspecified: Secondary | ICD-10-CM | POA: Diagnosis not present

## 2019-05-11 DIAGNOSIS — F411 Generalized anxiety disorder: Secondary | ICD-10-CM | POA: Diagnosis not present

## 2019-06-04 DIAGNOSIS — J449 Chronic obstructive pulmonary disease, unspecified: Secondary | ICD-10-CM | POA: Diagnosis not present

## 2019-06-04 DIAGNOSIS — R69 Illness, unspecified: Secondary | ICD-10-CM | POA: Diagnosis not present

## 2019-06-04 DIAGNOSIS — G8929 Other chronic pain: Secondary | ICD-10-CM | POA: Diagnosis not present

## 2019-06-04 DIAGNOSIS — E669 Obesity, unspecified: Secondary | ICD-10-CM | POA: Diagnosis not present

## 2019-06-04 DIAGNOSIS — Z791 Long term (current) use of non-steroidal anti-inflammatories (NSAID): Secondary | ICD-10-CM | POA: Diagnosis not present

## 2019-06-04 DIAGNOSIS — Z833 Family history of diabetes mellitus: Secondary | ICD-10-CM | POA: Diagnosis not present

## 2019-06-04 DIAGNOSIS — Z596 Low income: Secondary | ICD-10-CM | POA: Diagnosis not present

## 2019-06-04 DIAGNOSIS — J302 Other seasonal allergic rhinitis: Secondary | ICD-10-CM | POA: Diagnosis not present

## 2019-06-09 IMAGING — DX DG CHEST 2V
2 series · 2 of 2 positions shown · non-contrast
Comparison: 10/26/2018

CLINICAL DATA: Shortness of breath and wheezing, initial encounter

EXAM:
CHEST - 2 VIEW

[chest pa]
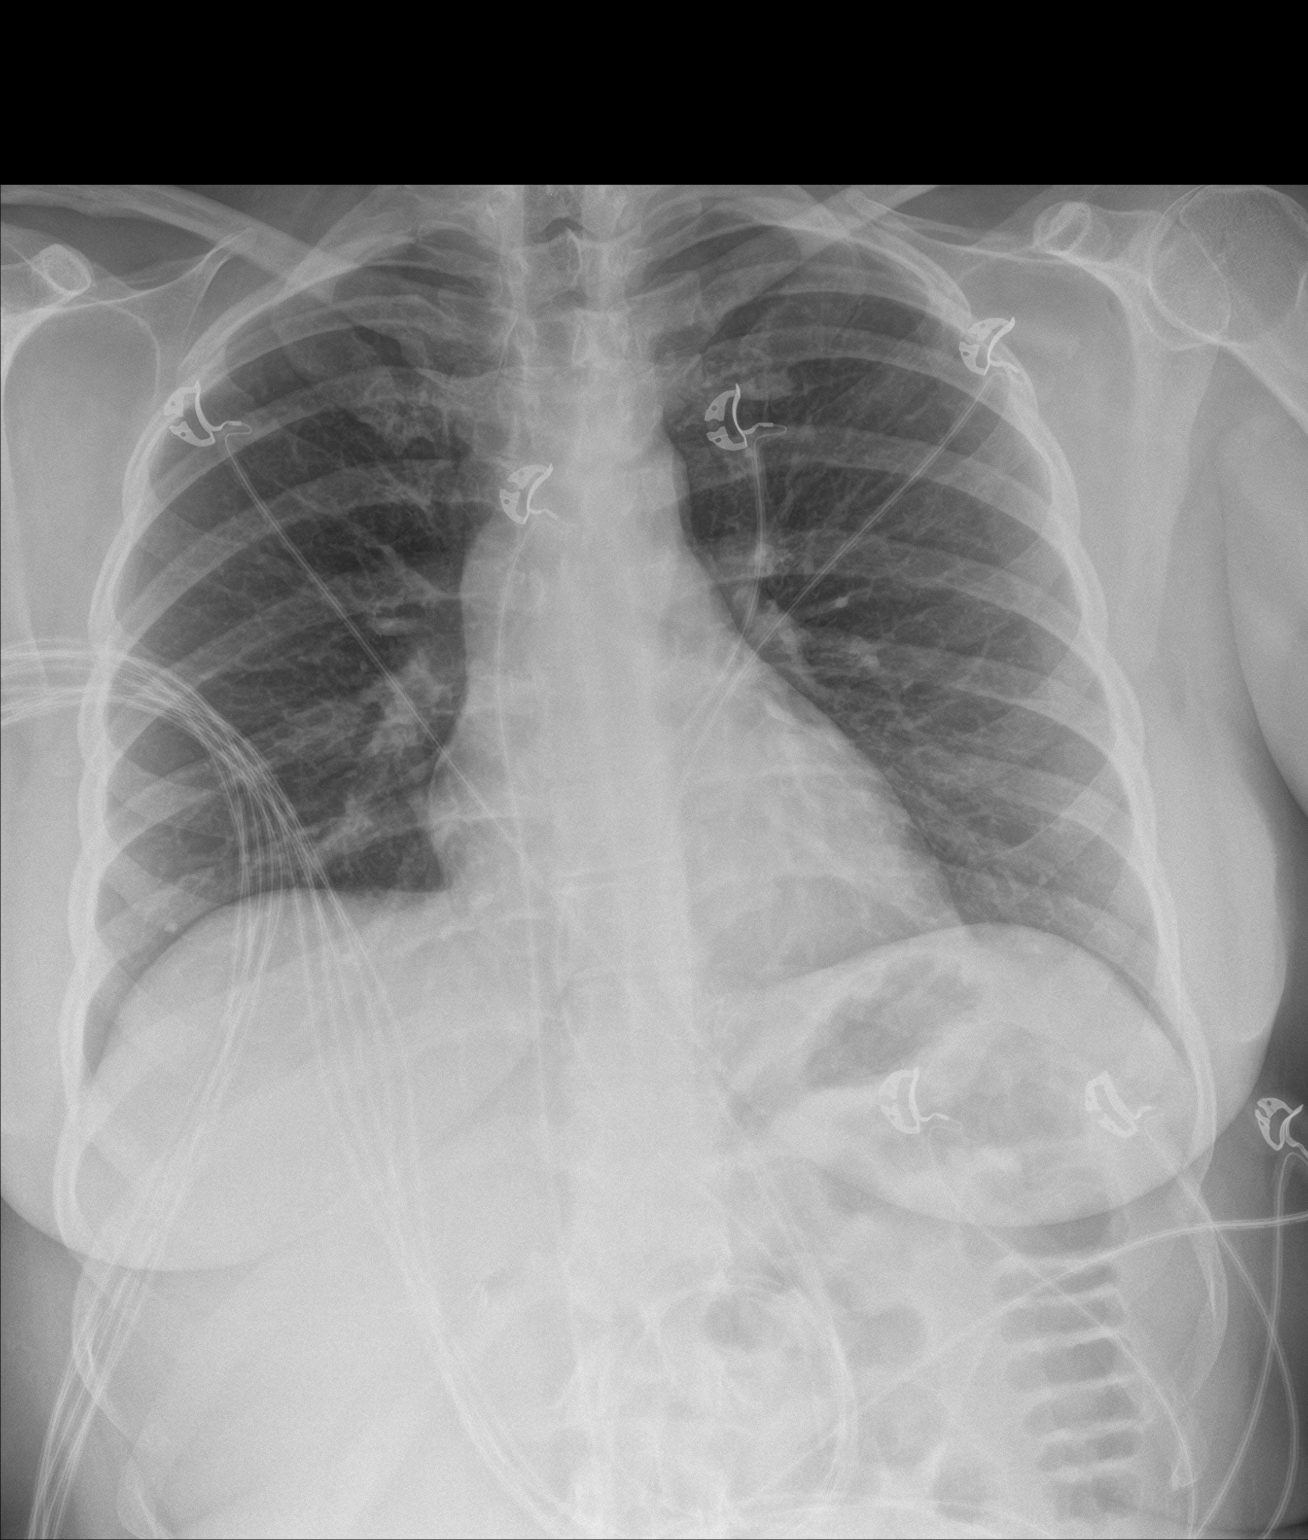

[chest lat]
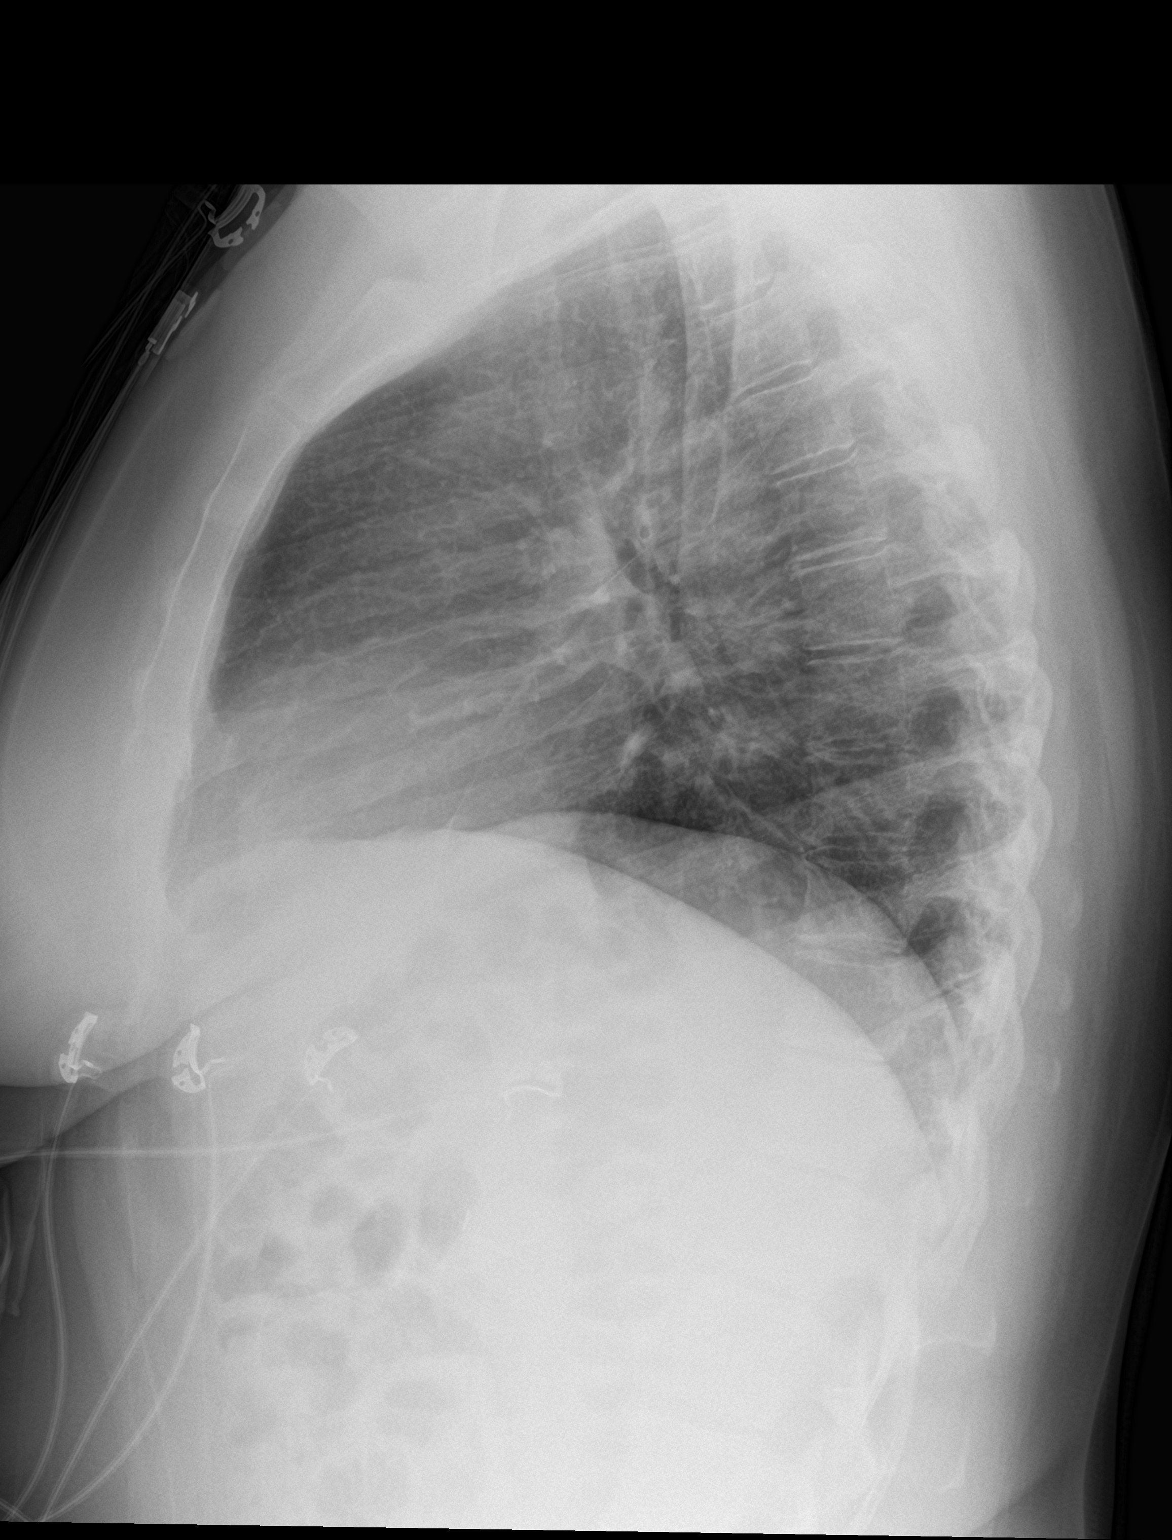

[2 of 2 positions shown; findings below may reference images not displayed]

FINDINGS: The heart size and mediastinal contours are within normal limits.
Both lungs are clear. The visualized skeletal structures are
unremarkable.
IMPRESSION: No active cardiopulmonary disease.

## 2019-06-10 DIAGNOSIS — R5383 Other fatigue: Secondary | ICD-10-CM | POA: Diagnosis not present

## 2019-06-10 DIAGNOSIS — M542 Cervicalgia: Secondary | ICD-10-CM | POA: Diagnosis not present

## 2019-06-10 DIAGNOSIS — R42 Dizziness and giddiness: Secondary | ICD-10-CM | POA: Diagnosis not present

## 2019-07-26 DIAGNOSIS — F411 Generalized anxiety disorder: Secondary | ICD-10-CM | POA: Diagnosis not present

## 2019-07-26 DIAGNOSIS — F410 Panic disorder [episodic paroxysmal anxiety] without agoraphobia: Secondary | ICD-10-CM | POA: Diagnosis not present

## 2019-07-26 DIAGNOSIS — F332 Major depressive disorder, recurrent severe without psychotic features: Secondary | ICD-10-CM | POA: Diagnosis not present

## 2019-07-27 DIAGNOSIS — E041 Nontoxic single thyroid nodule: Secondary | ICD-10-CM | POA: Diagnosis not present

## 2019-07-29 ENCOUNTER — Other Ambulatory Visit: Payer: Self-pay | Admitting: Endocrinology

## 2019-07-29 DIAGNOSIS — E041 Nontoxic single thyroid nodule: Secondary | ICD-10-CM

## 2019-08-02 DIAGNOSIS — J3089 Other allergic rhinitis: Secondary | ICD-10-CM | POA: Diagnosis not present

## 2019-08-02 DIAGNOSIS — J4540 Moderate persistent asthma, uncomplicated: Secondary | ICD-10-CM | POA: Diagnosis not present

## 2019-08-02 DIAGNOSIS — J301 Allergic rhinitis due to pollen: Secondary | ICD-10-CM | POA: Diagnosis not present

## 2019-08-02 DIAGNOSIS — H1045 Other chronic allergic conjunctivitis: Secondary | ICD-10-CM | POA: Diagnosis not present

## 2019-08-05 ENCOUNTER — Other Ambulatory Visit: Payer: Medicare HMO

## 2019-08-18 ENCOUNTER — Ambulatory Visit
Admission: RE | Admit: 2019-08-18 | Discharge: 2019-08-18 | Disposition: A | Discharge status: Home / Self Care | Payer: PPO | Source: Ambulatory Visit | Attending: Endocrinology | Admitting: Endocrinology

## 2019-08-18 DIAGNOSIS — E041 Nontoxic single thyroid nodule: Secondary | ICD-10-CM | POA: Diagnosis not present

## 2019-09-08 DIAGNOSIS — G8929 Other chronic pain: Secondary | ICD-10-CM | POA: Diagnosis not present

## 2019-09-08 DIAGNOSIS — M546 Pain in thoracic spine: Secondary | ICD-10-CM | POA: Diagnosis not present

## 2019-09-08 DIAGNOSIS — M62838 Other muscle spasm: Secondary | ICD-10-CM | POA: Diagnosis not present

## 2019-09-27 DIAGNOSIS — F411 Generalized anxiety disorder: Secondary | ICD-10-CM | POA: Diagnosis not present

## 2019-09-27 DIAGNOSIS — F410 Panic disorder [episodic paroxysmal anxiety] without agoraphobia: Secondary | ICD-10-CM | POA: Diagnosis not present

## 2019-09-27 DIAGNOSIS — F332 Major depressive disorder, recurrent severe without psychotic features: Secondary | ICD-10-CM | POA: Diagnosis not present

## 2019-10-12 ENCOUNTER — Other Ambulatory Visit: Payer: Self-pay

## 2019-10-12 DIAGNOSIS — Z20828 Contact with and (suspected) exposure to other viral communicable diseases: Secondary | ICD-10-CM | POA: Diagnosis not present

## 2019-10-12 DIAGNOSIS — Z20822 Contact with and (suspected) exposure to covid-19: Secondary | ICD-10-CM

## 2019-10-14 DIAGNOSIS — J019 Acute sinusitis, unspecified: Secondary | ICD-10-CM | POA: Diagnosis not present

## 2019-10-14 LAB — NOVEL CORONAVIRUS, NAA: SARS-CoV-2, NAA: NOT DETECTED

## 2019-10-19 ENCOUNTER — Encounter: Payer: Self-pay | Admitting: Adult Health

## 2019-10-19 ENCOUNTER — Other Ambulatory Visit: Payer: Self-pay

## 2019-10-19 ENCOUNTER — Ambulatory Visit (INDEPENDENT_AMBULATORY_CARE_PROVIDER_SITE_OTHER): Payer: PPO | Admitting: Adult Health

## 2019-10-19 VITALS — BP 133/72 | HR 67 | Ht 68.0 in | Wt 220.0 lb

## 2019-10-19 DIAGNOSIS — F332 Major depressive disorder, recurrent severe without psychotic features: Secondary | ICD-10-CM | POA: Diagnosis not present

## 2019-10-19 DIAGNOSIS — F411 Generalized anxiety disorder: Secondary | ICD-10-CM | POA: Diagnosis not present

## 2019-10-19 DIAGNOSIS — F410 Panic disorder [episodic paroxysmal anxiety] without agoraphobia: Secondary | ICD-10-CM | POA: Diagnosis not present

## 2019-10-19 DIAGNOSIS — F4310 Post-traumatic stress disorder, unspecified: Secondary | ICD-10-CM

## 2019-10-19 DIAGNOSIS — F429 Obsessive-compulsive disorder, unspecified: Secondary | ICD-10-CM

## 2019-10-19 DIAGNOSIS — G4700 Insomnia, unspecified: Secondary | ICD-10-CM

## 2019-10-19 MED ORDER — SERTRALINE HCL 100 MG PO TABS: 100.0000 mg | ORAL_TABLET | Freq: Every day | ORAL | 1 refills | Status: DC

## 2019-10-19 MED ORDER — BUPROPION HCL ER (SR) 150 MG PO TB12: 150.0000 mg | ORAL_TABLET | Freq: Every morning | ORAL | 1 refills | Status: DC

## 2019-10-19 NOTE — Progress Notes (Signed)
[facility] MD/PA/NP Initial Note  10/19/2019 11:15 AM [name]  MRN:  [id number]  Chief Complaint:  Chief Complaint    Depression; Anxiety; Insomnia; Panic Attack; Trauma      HPI:   Accompanied by husband.  Describes mood today as "ok". Pleasant. Anxious. Mood symptoms - reports depression, anxiety, and irritability - "some days are better than others". Feels like medications are "working well". Has "paranoia" at times - related to history of "steroid shots". Had prednisone injection that caused "psychosis". Started having "flashbacks". Symptoms have "mostly" resolved. Continues to have concerns about "steroid" use. Stable interest and motivation. Taking medications as prescribed.  Energy levels stable - "has ups and downs", "pushing herself to do more". Has started driving again. Active, does not have a regular exercise routine with physical disabilities. Has worked with P/T. Disabled 15 years.  Enjoys some usual interests and activities. Spending time with family - husband - married 57 years. Has 4 sons 51, 64, 15, and 45. One son and his family living with them currently.  Appetite adequate.  Weight loss - [employer] and walking. Sleeps well most nights. Averages 6 to 8 hours. Focus and concentration mostly stable. Forgetful at times. Completing tasks. Managing aspects of household.  Denies SI or HI. Denies AH or VH.  Previous medications: Lamictal - rash, Geodon, Zyprexa, Ativan, Prozac, Lexapro, Xanax, Clonazepam  Visit Diagnosis:    ICD-10-CM   1. Severe recurrent major depression without psychotic features (HCC)  F33.2 sertraline (ZOLOFT) 100 MG tablet    buPROPion (WELLBUTRIN SR) 150 MG 12 hr tablet  2. Panic disorder without agoraphobia  F41.0 sertraline (ZOLOFT) 100 MG tablet    buPROPion (WELLBUTRIN SR) 150 MG 12 hr tablet  3. Generalized anxiety disorder  F41.1 sertraline (ZOLOFT) 100 MG tablet    buPROPion (WELLBUTRIN SR) 150 MG 12 hr tablet  4. Insomnia, unspecified  type  G47.00   5. Obsessive-compulsive disorder, unspecified type  F42.9   6. PTSD (post-traumatic stress disorder)  F43.10     Past Psychiatric History: Previous ECT treatment. Seen at [facility]. Seeing therapist.   Past Medical History:  Past Medical History:  Diagnosis Date  . ADHD (attention deficit hyperactivity disorder)   . Anxiety    ptsd  . Depression    with psychosis, bipolar, ect therapy  . Goiter    multinodualr  . Herniated disc, cervical 04/2017   Has 2 herniated discs  . OCD (obsessive compulsive disorder)   . Suicidal behavior     Past Surgical History:  Procedure Laterality Date  . ABDOMINAL HYSTERECTOMY    . CHOLECYSTECTOMY    . CRYOTHERAPY     cervix  . TUBAL LIGATION      Family Psychiatric History: Denies  Family History:  Family History  Problem Relation Age of Onset  . Arthritis Other   . Diabetes Other   . ADD / ADHD Other   . Schizophrenia Father   . Drug abuse Father   . Bipolar disorder Mother   . Depression Mother   . Anxiety disorder Sister   . Anxiety disorder Brother   . Physical abuse Brother   . OCD Other   . Anxiety disorder Other     Social History:  Social History   Socioeconomic History  . Marital status: Married    Spouse name: Not on file  . Number of children: Not on file  . Years of education: Not on file  . Highest education level: Not on file  Occupational History  . Not on file  Social Needs  . Financial resource strain: Not on file  . Food insecurity    Worry: Not on file    Inability: Not on file  . Transportation needs    Medical: Not on file    Non-medical: Not on file  Tobacco Use  . Smoking status: Never Smoker  . Smokeless tobacco: Never Used  Substance and Sexual Activity  . Alcohol use: No  . Drug use: No  . Sexual activity: Yes    Partners: Male    Birth control/protection: Surgical  Lifestyle  . Physical activity    Days per week: Not on file    Minutes per session: Not on file  .  Stress: Not on file  Relationships  . Social Herbalist on phone: Not on file    Gets together: Not on file    Attends religious service: Not on file    Active member of club or organization: Not on file    Attends meetings of clubs or organizations: Not on file    Relationship status: Not on file  Other Topics Concern  . Not on file  Social History Narrative  . Not on file    Allergies:  Allergies  Allergen Reactions  . Ceftriaxone Sodium     REACTION: unspecified  . Lorazepam     REACTION: unspecified  . Prednisone     Metabolic Disorder Labs: No results found for: HGBA1C, MPG No results found for: PROLACTIN No results found for: CHOL, TRIG, HDL, CHOLHDL, VLDL, LDLCALC   Therapeutic Level Labs: No results found for: LITHIUM No results found for: VALPROATE No components found for:  CBMZ  Current Medications: Current Outpatient Medications  Medication Sig Dispense Refill  . albuterol (PROVENTIL HFA;VENTOLIN HFA) 108 (90 Base) MCG/ACT inhaler Inhale 2 puffs into the lungs every 4 (four) hours as needed for up to 30 days for wheezing or shortness of breath. 1 Inhaler 3  . buPROPion (WELLBUTRIN SR) 150 MG 12 hr tablet Take 1 tablet (150 mg total) by mouth every morning. 90 tablet 1  . calcium carbonate (OS-CAL) 600 MG TABS Take 600 mg by mouth daily.      . cholecalciferol (VITAMIN D) 1000 UNITS tablet Take 1,000 Units by mouth daily.      . diclofenac (VOLTAREN) 75 MG EC tablet TAKE 1 TABLET BY MOUTH EVERY 12 HOURS WITH FOOD AS NEEDED    . FOLIC ACID PO Take by mouth daily. Take 2 mg     . montelukast (SINGULAIR) 10 MG tablet Take 10 mg by mouth daily.    . propranolol (INDERAL) 10 MG tablet Take 10 mg by mouth 3 (three) times daily as needed.     . sertraline (ZOLOFT) 100 MG tablet Take 1 tablet (100 mg total) by mouth daily. 180 tablet 1  . tiZANidine (ZANAFLEX) 4 MG tablet Take 4 mg by mouth 3 (three) times daily as needed.     No current  facility-administered medications for this visit.     Medication Side Effects: none  Orders placed this visit:  No orders of the defined types were placed in this encounter.   Psychiatric Specialty Exam:  ROS  Blood pressure 133/72, pulse 67, height 5\' 8"  (1.727 m), weight 220 lb (99.8 kg).Body mass index is 33.45 kg/m.  General Appearance: Neat and Well Groomed  Eye Contact:  Good  Speech:  Clear and Coherent  Volume:  Normal  Mood:  Euthymic  Affect:  Congruent  Thought Process:  Coherent and Goal Directed  Orientation:  Full (Time, Place, and Person)  Thought Content: Logical   Suicidal Thoughts:  No  Homicidal Thoughts:  No  Memory:  WNL  Judgement:  Good  Insight:  Good  Psychomotor Activity:  Normal  Concentration:  Concentration: Good  Recall:  Good  Fund of Knowledge: Good  Language: Good  Assets:  Communication Skills Desire for Improvement Financial Resources/Insurance Housing Intimacy Leisure Time Physical Health Resilience Social Support Talents/Skills Transportation Vocational/Educational  ADL's:  Intact  Cognition: WNL  Prognosis:  Good   Screenings: None  Receiving Psychotherapy: No    Treatment Plan/Recommendations:   Plan:  1.Wellbutrin Sr 150mg   2. Zoloft 100mg  tablet - 2 daiy 3. Propanolol 10mg  BID  Seen at [place] - taking 3 classes there.  Previousy seen by Dr. Daron Offer   RTC 4 weeks  Patient advised to contact office with any questions, adverse effects, or acute worsening in signs and symptoms.   Aloha Gell, NP

## 2019-10-27 ENCOUNTER — Telehealth: Payer: Self-pay | Admitting: Adult Health

## 2019-10-27 NOTE — Telephone Encounter (Signed)
Pt called to report picked Rx from [facility] she realized the dose on the bag was 100 mg 1/d. She takes 200 mg 1/d. Insurance will not pay for another Rx. She did not notice until she got home. Please advise how to fix this. 90 day supply. [phone].

## 2019-10-27 NOTE — Telephone Encounter (Signed)
So I spoke with the pharmacist and she stated that she picked up #90. She stated that before/when she runs out for [place] to send in a new Rx with the correct instructions and a note saying that her dose was increased. She said they can do an insurance over ride if they have to.

## 2019-10-28 ENCOUNTER — Other Ambulatory Visit: Payer: Self-pay

## 2019-10-28 DIAGNOSIS — F410 Panic disorder [episodic paroxysmal anxiety] without agoraphobia: Secondary | ICD-10-CM

## 2019-10-28 DIAGNOSIS — F332 Major depressive disorder, recurrent severe without psychotic features: Secondary | ICD-10-CM

## 2019-10-28 DIAGNOSIS — F411 Generalized anxiety disorder: Secondary | ICD-10-CM

## 2019-10-28 MED ORDER — SERTRALINE HCL 100 MG PO TABS: 200.0000 mg | ORAL_TABLET | Freq: Every day | ORAL | 1 refills | Status: DC

## 2019-10-28 NOTE — Telephone Encounter (Signed)
Pt. Made aware and verbalized understanding.

## 2019-11-19 DIAGNOSIS — R310 Gross hematuria: Secondary | ICD-10-CM | POA: Diagnosis not present

## 2019-11-19 DIAGNOSIS — R410 Disorientation, unspecified: Secondary | ICD-10-CM | POA: Diagnosis not present

## 2019-11-19 DIAGNOSIS — L84 Corns and callosities: Secondary | ICD-10-CM | POA: Diagnosis not present

## 2019-11-22 DIAGNOSIS — R410 Disorientation, unspecified: Secondary | ICD-10-CM | POA: Diagnosis not present

## 2019-11-22 DIAGNOSIS — R05 Cough: Secondary | ICD-10-CM | POA: Diagnosis not present

## 2019-11-22 DIAGNOSIS — Z9189 Other specified personal risk factors, not elsewhere classified: Secondary | ICD-10-CM | POA: Diagnosis not present

## 2019-11-22 DIAGNOSIS — J069 Acute upper respiratory infection, unspecified: Secondary | ICD-10-CM | POA: Diagnosis not present

## 2019-11-22 DIAGNOSIS — R5383 Other fatigue: Secondary | ICD-10-CM | POA: Diagnosis not present

## 2019-11-23 ENCOUNTER — Telehealth: Payer: Self-pay | Admitting: Adult Health

## 2019-11-23 DIAGNOSIS — E559 Vitamin D deficiency, unspecified: Secondary | ICD-10-CM | POA: Diagnosis not present

## 2019-11-23 DIAGNOSIS — R410 Disorientation, unspecified: Secondary | ICD-10-CM | POA: Diagnosis not present

## 2019-11-23 DIAGNOSIS — R419 Unspecified symptoms and signs involving cognitive functions and awareness: Secondary | ICD-10-CM | POA: Diagnosis not present

## 2019-11-23 DIAGNOSIS — R001 Bradycardia, unspecified: Secondary | ICD-10-CM | POA: Diagnosis not present

## 2019-11-23 DIAGNOSIS — E0590 Thyrotoxicosis, unspecified without thyrotoxic crisis or storm: Secondary | ICD-10-CM | POA: Diagnosis not present

## 2019-11-23 DIAGNOSIS — R300 Dysuria: Secondary | ICD-10-CM | POA: Diagnosis not present

## 2019-11-23 NOTE — Telephone Encounter (Signed)
Pt having severe confusion. Pt does not think its the meds she is on. She has already had some blood work done but results are not back yet. Please call.

## 2019-11-24 ENCOUNTER — Encounter: Payer: Self-pay | Admitting: Neurology

## 2019-11-24 NOTE — Telephone Encounter (Signed)
Pt. Made aware. She wants to try and get in sooner than her appt. On 12/22 so I sent her up front to try and schedule. She did state that she has seen her PCP several times for this and they suggest her to follow up with [name].

## 2019-11-27 IMAGING — US US THYROID
1 series · 14 of 25 positions shown · non-contrast
Comparison: 11/22/2009

CLINICAL DATA: Prior ultrasound follow-up. History 4 mm cystic
nodule in the mid right lobe.

EXAM:
THYROID ULTRASOUND
TECHNIQUE: Ultrasound examination of the thyroid gland and adjacent soft
tissues was performed.

[Series 1: us thyroid · 0.05mm/px · 14 of 43 slices shown]
[im 1/43]
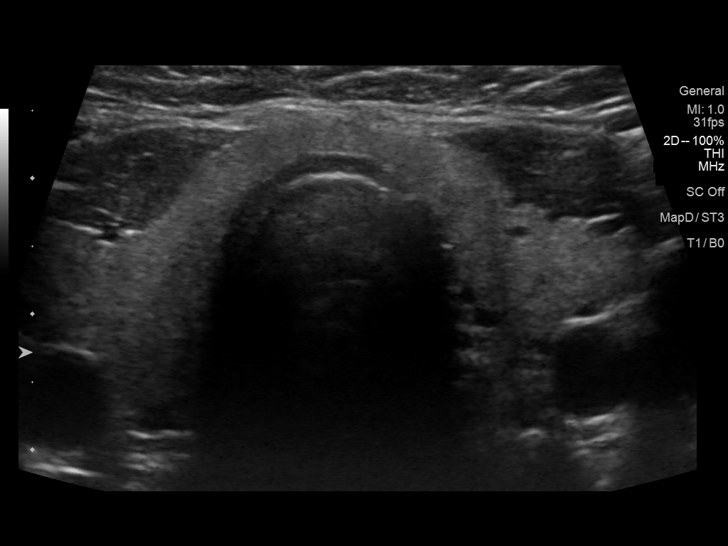
[im 4/43]
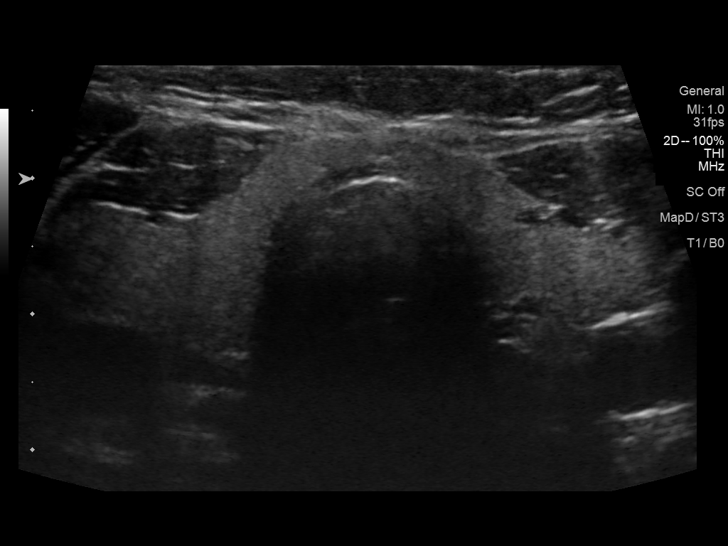
[im 8/43]
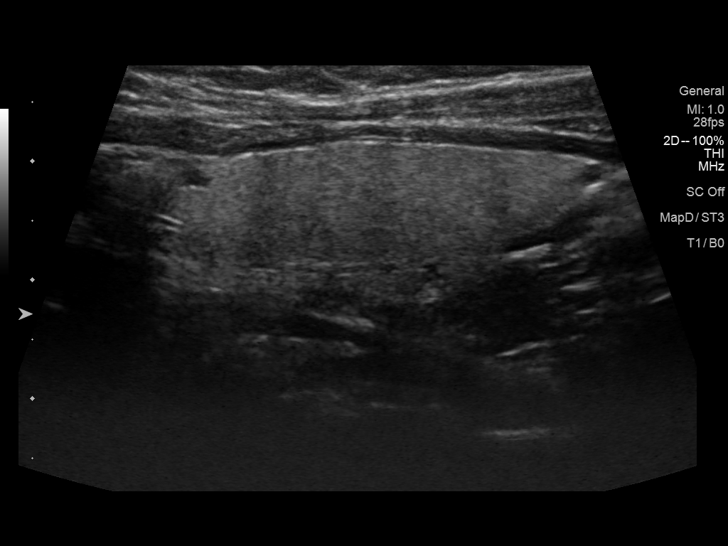
[im 11/43]
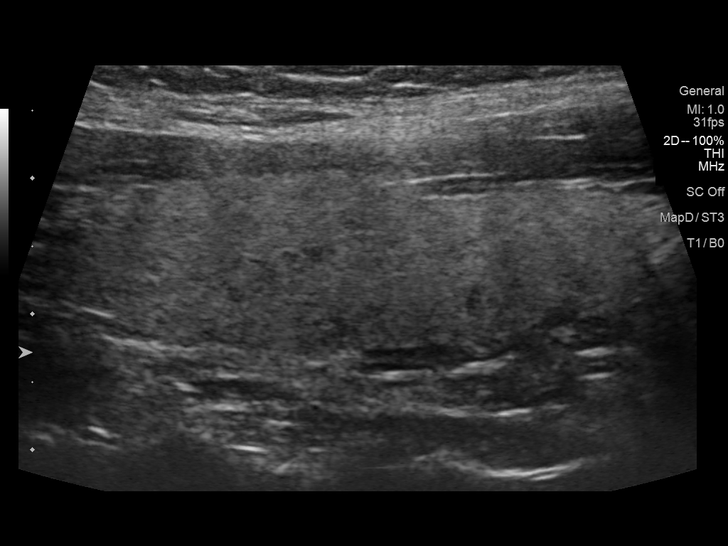
[im 15/43]
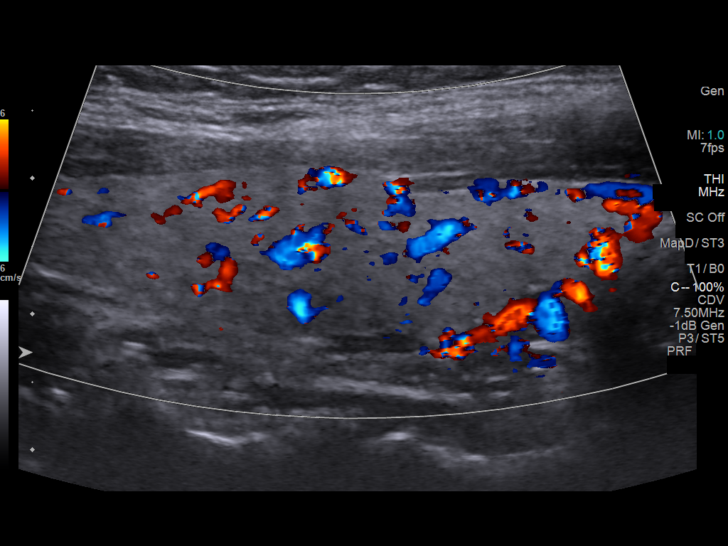
[im 16/43]
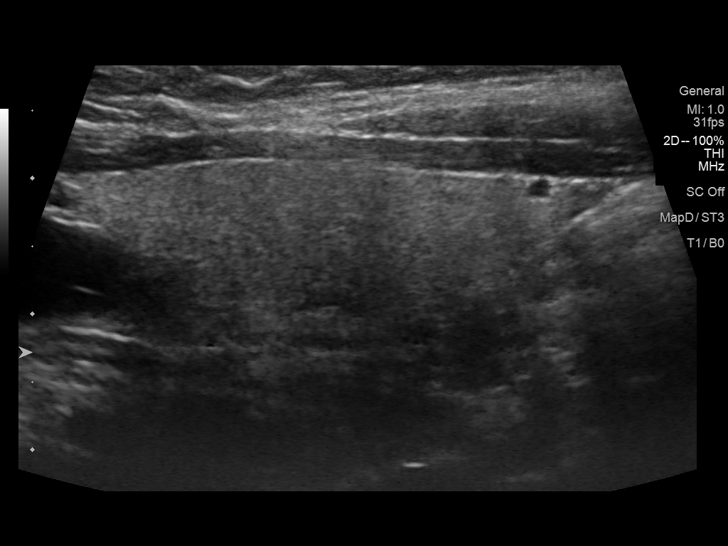
[im 20/43]
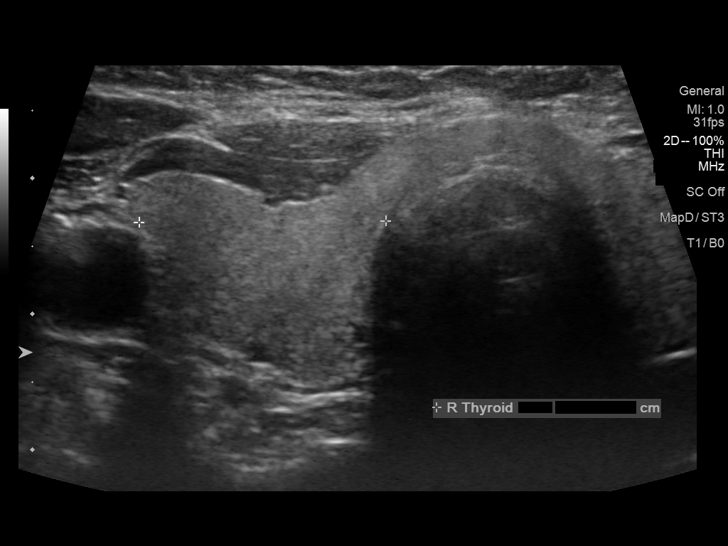
[im 23/43]
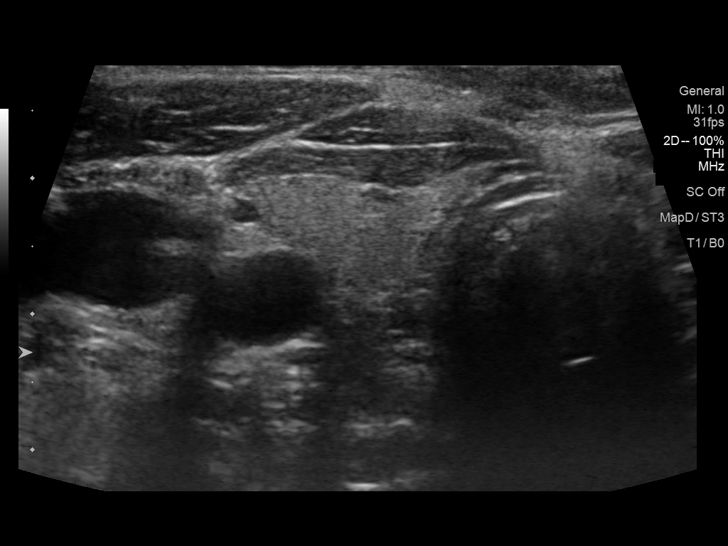
[im 27/43]
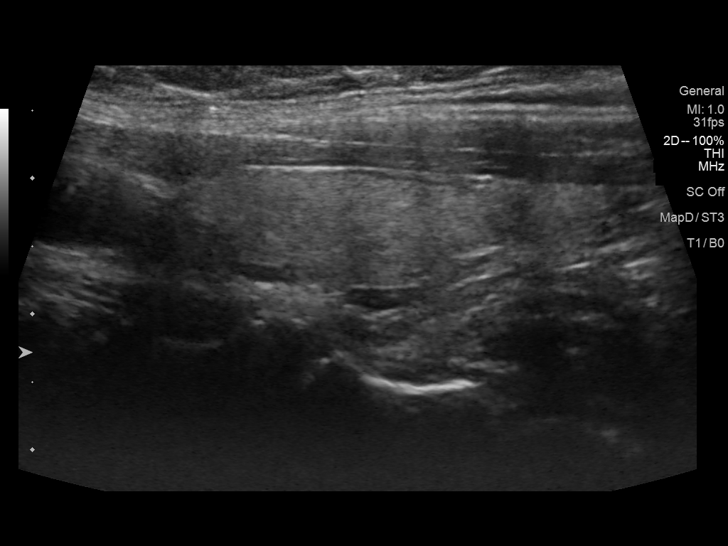
[im 29/43]
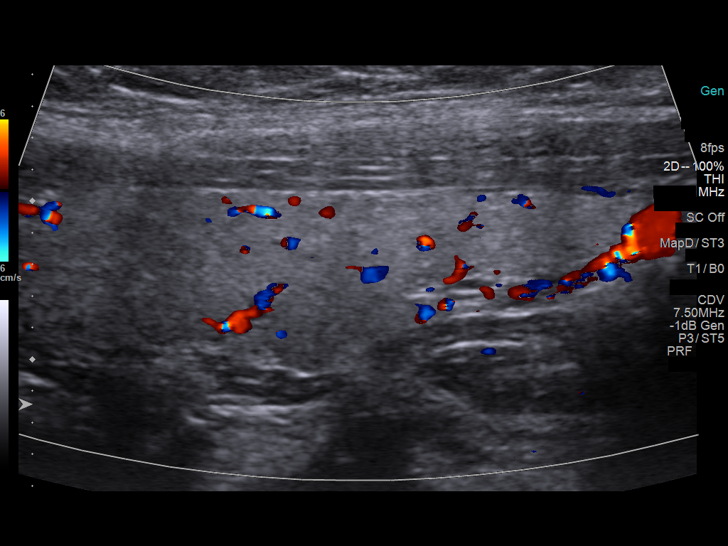
[im 32/43]
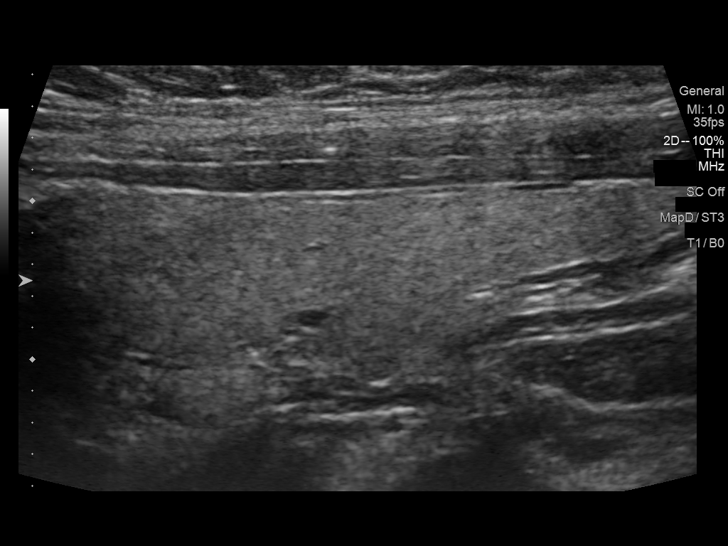
[im 36/43]
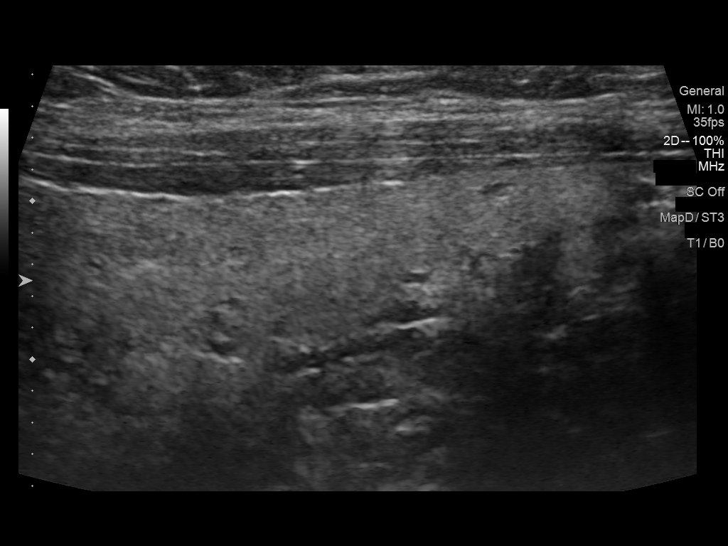
[im 39/43]
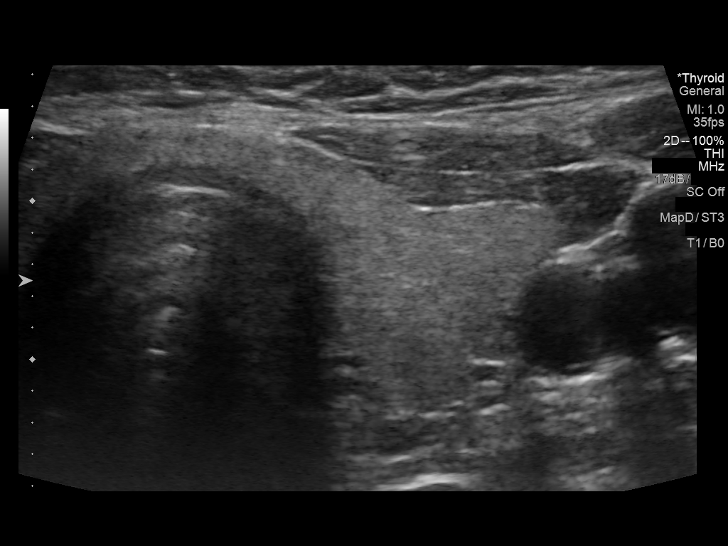
[im 43/43]
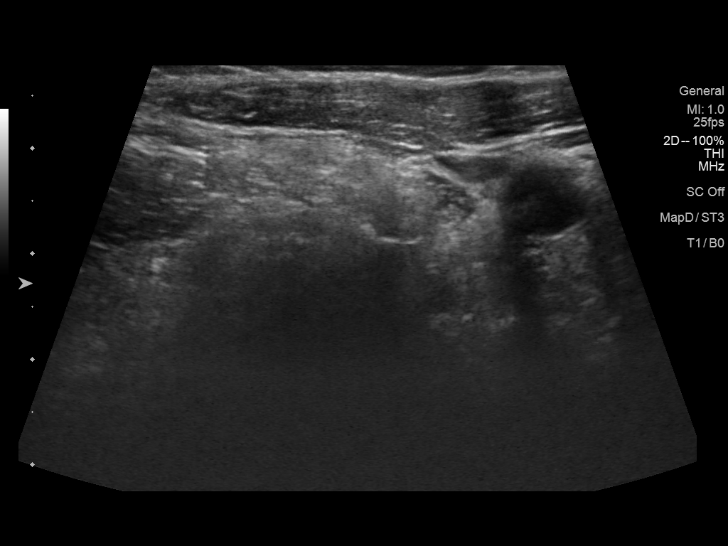

[14 of 25 positions shown; findings below may reference images not displayed]

FINDINGS: Parenchymal Echotexture: Normal

Isthmus: 0.4 cm

Right lobe: 4.4 x 1.1 x 1.8 cm

Left lobe: 4.5 x 1.2 x 1.5 cm

_________________________________________________________

Estimated total number of nodules >/= 1 cm: 0

Number of spongiform nodules >/=  2 cm not described below (TR1): 0

Number of mixed cystic and solid nodules >/= 1.5 cm not described
below (TR2): 0

_________________________________________________________

No discrete nodules are seen within the thyroid gland.
IMPRESSION: Normal thyroid ultrasound. Previously seen nodule in the midportion
of the right lobe is no longer present.

The above is in keeping with the ACR TI-RADS recommendations - [HOSPITAL] 9353;[DATE].

## 2019-11-29 ENCOUNTER — Encounter: Payer: Self-pay | Admitting: Adult Health

## 2019-11-29 ENCOUNTER — Ambulatory Visit (INDEPENDENT_AMBULATORY_CARE_PROVIDER_SITE_OTHER): Payer: PPO | Admitting: Adult Health

## 2019-11-29 ENCOUNTER — Other Ambulatory Visit: Payer: Self-pay

## 2019-11-29 DIAGNOSIS — F429 Obsessive-compulsive disorder, unspecified: Secondary | ICD-10-CM

## 2019-11-29 DIAGNOSIS — F411 Generalized anxiety disorder: Secondary | ICD-10-CM

## 2019-11-29 DIAGNOSIS — F410 Panic disorder [episodic paroxysmal anxiety] without agoraphobia: Secondary | ICD-10-CM | POA: Diagnosis not present

## 2019-11-29 DIAGNOSIS — F4310 Post-traumatic stress disorder, unspecified: Secondary | ICD-10-CM

## 2019-11-29 DIAGNOSIS — G4700 Insomnia, unspecified: Secondary | ICD-10-CM

## 2019-11-29 DIAGNOSIS — F332 Major depressive disorder, recurrent severe without psychotic features: Secondary | ICD-10-CM | POA: Diagnosis not present

## 2019-11-29 NOTE — Progress Notes (Signed)
[facility] MD/PA/NP Medication Check  11/29/2019 9:51 AM [name]  MRN:  [id number]  Chief Complaint:    HPI:   Accompanied by husband.  Describes mood today as "ok". Pleasant. Nervous. Mood symptoms - reports depression, anxiety, and irritability. More anxious overall. Having a tremendous amount of anxiety. Stating "I'm not doing as well as I was last time". Reports having "bad, crazy thoughts when anxiety is high". Has some "paranoia" when she goes out in public. Getting so anxious she is starting to "cry". Worried about taking medications to calm her down - "they make me depressed". Has bee taking a "trauma" class for the past 2 months. Previous psychosis with steroids. Stable interest and motivation. Taking medications as prescribed.  Energy levels lower. Stating "I'm trying to make myself do better", "Fake it to you make it". Active, does not have a regular exercise routine with physical disabilities. Disabled 15 years.  Enjoys some usual interests and activities. Spending time with family - husband - married 8 years. Has 4 sons 29, 67, 56, and 83. One son and his family living with them currently. Stating "Thanksgiving was hard". Family visited.  Appetite adequate. Weight loss 30+ pounds. [employer] and walking. Sleeps well most nights. Averages 5 to 8 hours. Wakes up during the night, but does go back to sleep.  Focus and concentration "horrible". My comprehension is "messed up". Completing task. Managing aspects of household with help of husband. Denies SI or HI. Denies AH or VH.  ECT 2009 - did well for her, but memory was bad.   Previous medications: Lamictal - rash, Geodon, Zyprexa, Ativan, Prozac, Lexapro, Xanax, Clonazepam, Xanax  Visit Diagnosis:    ICD-10-CM   1. Severe recurrent major depression without psychotic features ([place])  F33.2   2. Panic disorder without agoraphobia  F41.0   3. Generalized anxiety disorder  F41.1   4. Insomnia, unspecified type  G47.00   5.  Obsessive-compulsive disorder, unspecified type  F42.9   6. PTSD (post-traumatic stress disorder)  F43.10     Past Psychiatric History: Previous ECT treatment. Seen at [facility]. Seeing therapist.   Past Medical History:  Past Medical History:  Diagnosis Date  . ADHD (attention deficit hyperactivity disorder)   . Anxiety    ptsd  . Depression    with psychosis, bipolar, ect therapy  . Goiter    multinodualr  . Herniated disc, cervical 04/2017   Has 2 herniated discs  . OCD (obsessive compulsive disorder)   . Suicidal behavior     Past Surgical History:  Procedure Laterality Date  . ABDOMINAL HYSTERECTOMY    . CHOLECYSTECTOMY    . CRYOTHERAPY     cervix  . TUBAL LIGATION      Family Psychiatric History: Denies  Family History:  Family History  Problem Relation Age of Onset  . Arthritis Other   . Diabetes Other   . ADD / ADHD Other   . Schizophrenia Father   . Drug abuse Father   . Bipolar disorder Mother   . Depression Mother   . Anxiety disorder Sister   . Anxiety disorder Brother   . Physical abuse Brother   . OCD Other   . Anxiety disorder Other     Social History:  Social History   Socioeconomic History  . Marital status: Married    Spouse name: Not on file  . Number of children: Not on file  . Years of education: Not on file  . Highest education level: Not  on file  Occupational History  . Not on file  Social Needs  . Financial resource strain: Not on file  . Food insecurity    Worry: Not on file    Inability: Not on file  . Transportation needs    Medical: Not on file    Non-medical: Not on file  Tobacco Use  . Smoking status: Never Smoker  . Smokeless tobacco: Never Used  Substance and Sexual Activity  . Alcohol use: No  . Drug use: No  . Sexual activity: Yes    Partners: Male    Birth control/protection: Surgical  Lifestyle  . Physical activity    Days per week: Not on file    Minutes per session: Not on file  . Stress: Not on file   Relationships  . Social Herbalist on phone: Not on file    Gets together: Not on file    Attends religious service: Not on file    Active member of club or organization: Not on file    Attends meetings of clubs or organizations: Not on file    Relationship status: Not on file  Other Topics Concern  . Not on file  Social History Narrative  . Not on file    Allergies:  Allergies  Allergen Reactions  . Ceftriaxone Sodium     REACTION: unspecified  . Lorazepam     REACTION: unspecified  . Prednisone     Metabolic Disorder Labs: No results found for: HGBA1C, MPG No results found for: PROLACTIN No results found for: CHOL, TRIG, HDL, CHOLHDL, VLDL, LDLCALC   Therapeutic Level Labs: No results found for: LITHIUM No results found for: VALPROATE No components found for:  CBMZ  Current Medications: Current Outpatient Medications  Medication Sig Dispense Refill  . albuterol (PROVENTIL HFA;VENTOLIN HFA) 108 (90 Base) MCG/ACT inhaler Inhale 2 puffs into the lungs every 4 (four) hours as needed for up to 30 days for wheezing or shortness of breath. 1 Inhaler 3  . buPROPion (WELLBUTRIN SR) 150 MG 12 hr tablet Take 1 tablet (150 mg total) by mouth every morning. 90 tablet 1  . calcium carbonate (OS-CAL) 600 MG TABS Take 600 mg by mouth daily.      . cholecalciferol (VITAMIN D) 1000 UNITS tablet Take 1,000 Units by mouth daily.      . diclofenac (VOLTAREN) 75 MG EC tablet TAKE 1 TABLET BY MOUTH EVERY 12 HOURS WITH FOOD AS NEEDED    . FOLIC ACID PO Take by mouth daily. Take 2 mg     . montelukast (SINGULAIR) 10 MG tablet Take 10 mg by mouth daily.    . propranolol (INDERAL) 10 MG tablet Take 10 mg by mouth 3 (three) times daily as needed.     . sertraline (ZOLOFT) 100 MG tablet Take 2 tablets (200 mg total) by mouth daily. 180 tablet 1  . tiZANidine (ZANAFLEX) 4 MG tablet Take 4 mg by mouth 3 (three) times daily as needed.     No current facility-administered medications  for this visit.     Medication Side Effects: none  Orders placed this visit:  No orders of the defined types were placed in this encounter.   Psychiatric Specialty Exam:  ROS  There were no vitals taken for this visit.There is no height or weight on file to calculate BMI.  General Appearance: Neat and Well Groomed  Eye Contact:  Good  Speech:  Clear and Coherent  Volume:  Normal  Mood:  Anxious  Affect:  Congruent  Thought Process:  Coherent, Goal Directed and Descriptions of Associations: Intact  Orientation:  Full (Time, Place, and Person)  Thought Content: Logical   Suicidal Thoughts:  No  Homicidal Thoughts:  No  Memory:  WNL  Judgement:  Good  Insight:  Good  Psychomotor Activity:  Normal  Concentration:  Concentration: Good  Recall:  Good  Fund of Knowledge: Good  Language: Good  Assets:  Communication Skills Desire for Improvement Financial Resources/Insurance Housing Intimacy Leisure Time Physical Health Resilience Social Support Talents/Skills Transportation Vocational/Educational  ADL's:  Intact  Cognition: WNL  Prognosis:  Good   Screenings: None  Receiving Psychotherapy: No    Treatment Plan/Recommendations:   Plan:  1. Wellbutrin SR 150mg   2. Zoloft 100mg  tablet - 2 daily 3. Propanolol 10mg  BID - doesn't take unless she has too. Makes her feel light headed and dizzy.   Will not add any new medications at this time per patient - would like to get genetic testing.   Genetic testing.  See therapist for anxiety.  Seen at [facility] - peer counseling.  Previousy seen by Dr. Daron Offer   RTC 4 weeks  Patient advised to contact office with any questions, adverse effects, or acute worsening in signs and symptoms.   Aloha Gell, NP

## 2019-12-02 ENCOUNTER — Encounter: Payer: Self-pay | Admitting: Podiatry

## 2019-12-02 ENCOUNTER — Ambulatory Visit: Payer: PPO | Admitting: Podiatry

## 2019-12-02 ENCOUNTER — Other Ambulatory Visit: Payer: Self-pay

## 2019-12-02 ENCOUNTER — Ambulatory Visit (INDEPENDENT_AMBULATORY_CARE_PROVIDER_SITE_OTHER): Payer: PPO

## 2019-12-02 VITALS — BP 127/71 | HR 66

## 2019-12-02 DIAGNOSIS — L989 Disorder of the skin and subcutaneous tissue, unspecified: Secondary | ICD-10-CM

## 2019-12-02 DIAGNOSIS — M2042 Other hammer toe(s) (acquired), left foot: Secondary | ICD-10-CM | POA: Diagnosis not present

## 2019-12-02 DIAGNOSIS — M2041 Other hammer toe(s) (acquired), right foot: Secondary | ICD-10-CM | POA: Diagnosis not present

## 2019-12-02 DIAGNOSIS — M21619 Bunion of unspecified foot: Secondary | ICD-10-CM | POA: Diagnosis not present

## 2019-12-02 DIAGNOSIS — L84 Corns and callosities: Secondary | ICD-10-CM | POA: Diagnosis not present

## 2019-12-02 NOTE — Progress Notes (Signed)
Subjective:   Patient ID: [name], female   DOB: 48 y.o.   MRN: [id number]   HPI Patient presents stating that she has these painful corns on the fourth toe right over left that is been present for years and she is tried medicated corn pads on it has a painful bunion deformity left that she has tried wider shoes and other modalities without relief and it is gradually become more of an issue and patient is noted to have redness around his head and also does not smoke likes to be active   Review of Systems  All other systems reviewed and are negative.       Objective:  Physical Exam Vitals signs and nursing note reviewed.  Constitutional:      Appearance: She is well-developed.  Pulmonary:     Effort: Pulmonary effort is normal.  Musculoskeletal: Normal range of motion.  Skin:    General: Skin is warm.  Neurological:     Mental Status: She is alert.     Neurovascular status found to be intact muscle strength found to be adequate range of motion within normal limits.  Patient is noted to have a painful keratotic lesion digit 4 right over left with pain upon palpation with the distal medial keratotic lesion digit 5 right that is painful when pressed and make shoe gear difficult.  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  I recommended arthroplasty digit for bilateral along with exostectomy digit 5 right and structural bunion correction left and I reviewed what would be required and patient wants surgery and is scheduled  for surgery mid January and will reappoint for consultation.  Encouraged her to write down all questions she will have  X-rays indicate that there is elevation of the intermetatarsal angle left with prominence around the first metatarsal head with rotation fifth digit bilateral pressing against the fourth digit bilateral

## 2019-12-18 ENCOUNTER — Other Ambulatory Visit: Payer: Self-pay

## 2019-12-18 ENCOUNTER — Ambulatory Visit
Admission: EM | Admit: 2019-12-18 | Discharge: 2019-12-18 | Disposition: A | Discharge status: Home / Self Care | Payer: PPO | Attending: Emergency Medicine | Admitting: Emergency Medicine

## 2019-12-18 DIAGNOSIS — R519 Headache, unspecified: Secondary | ICD-10-CM | POA: Diagnosis not present

## 2019-12-18 DIAGNOSIS — Z20828 Contact with and (suspected) exposure to other viral communicable diseases: Secondary | ICD-10-CM | POA: Diagnosis not present

## 2019-12-18 DIAGNOSIS — J029 Acute pharyngitis, unspecified: Secondary | ICD-10-CM | POA: Diagnosis not present

## 2019-12-18 DIAGNOSIS — G501 Atypical facial pain: Secondary | ICD-10-CM | POA: Diagnosis not present

## 2019-12-18 DIAGNOSIS — Z20822 Contact with and (suspected) exposure to covid-19: Secondary | ICD-10-CM

## 2019-12-18 NOTE — ED Provider Notes (Signed)
[facility]    CSN: [id number] Arrival date & time: 12/18/19  1156      History   Chief Complaint Chief Complaint  Patient presents with  . Headache  . Facial Pain  . Sore Throat    HPI [name] is a 48 y.o. female.   [name] 48 years old female presented to the urgent care for complaint of headache sinus pressure and sinus pain, sore throat that started last night.  Patient was exposed to his son that tested positive for COVID-19 3 days ago.  Denies sick exposure to  flu or strep.  Denies recent travel.  Denies aggravating or alleviating symptoms.  Denies previous COVID infection.   Denies fever, chills, fatigue, nasal congestion, rhinorrhea, cough, SOB, wheezing, chest pain, nausea, vomiting, changes in bowel or bladder habits.     Headache Associated symptoms: sinus pressure and sore throat   Sore Throat Associated symptoms include headaches.    Past Medical History:  Diagnosis Date  . ADHD (attention deficit hyperactivity disorder)   . Anxiety    ptsd  . Depression    with psychosis, bipolar, ect therapy  . Goiter    multinodualr  . Herniated disc, cervical 04/2017   Has 2 herniated discs  . OCD (obsessive compulsive disorder)   . Suicidal behavior     Patient Active Problem List   Diagnosis Date Noted  . Asthma exacerbation 02/28/2019  . ADHD, predominantly inattentive type 09/05/2017  . ACUTE SINUSITIS, UNSPECIFIED 02/08/2009  . ANXIETY DISORDER, GENERALIZED 09/08/2008  . HYPERTHYROIDISM 08/21/2008  . PALPITATIONS 08/21/2008  . CHEST PAIN 08/21/2008  . COCCYGEAL PAIN 12/29/2007  . CYSTITIS, ACUTE 10/06/2007  . GOITER NOS 09/01/2007  . ANEMIA-NOS 09/01/2007  . DEPRESSION 09/01/2007  . ASTHMA 09/01/2007  . SYNCOPE 09/01/2007    Past Surgical History:  Procedure Laterality Date  . ABDOMINAL HYSTERECTOMY    . CHOLECYSTECTOMY    . CRYOTHERAPY     cervix  . TUBAL LIGATION      OB History   No obstetric history on  file.      Home Medications    Prior to Admission medications   Medication Sig Start Date End Date Taking? Authorizing Provider  albuterol (PROVENTIL HFA;VENTOLIN HFA) 108 (90 Base) MCG/ACT inhaler Inhale 2 puffs into the lungs every 4 (four) hours as needed for up to 30 days for wheezing or shortness of breath. 03/01/19 03/31/19  Manuella Ghazi, Pratik D, DO  buPROPion (WELLBUTRIN SR) 150 MG 12 hr tablet Take 1 tablet (150 mg total) by mouth every morning. 10/19/19   Mozingo, Berdie Ogren, NP  calcium carbonate (OS-CAL) 600 MG TABS Take 600 mg by mouth daily.      [provider]  cholecalciferol (VITAMIN D) 1000 UNITS tablet Take 1,000 Units by mouth daily.      [provider]  diclofenac (VOLTAREN) 75 MG EC tablet TAKE 1 TABLET BY MOUTH EVERY 12 HOURS WITH FOOD AS NEEDED 09/08/19   [provider]  FOLIC ACID PO Take by mouth daily. Take 2 mg     [provider]  montelukast (SINGULAIR) 10 MG tablet Take 10 mg by mouth daily. 09/29/19   [provider]  propranolol (INDERAL) 10 MG tablet Take 10 mg by mouth 3 (three) times daily as needed.  12/11/18   [provider]  sertraline (ZOLOFT) 100 MG tablet Take 2 tablets (200 mg total) by mouth daily. 10/28/19   Mozingo, Berdie Ogren, NP  tiZANidine (ZANAFLEX)  4 MG tablet Take 4 mg by mouth 3 (three) times daily as needed. 09/08/19   [provider]    Family History Family History  Problem Relation Age of Onset  . Arthritis Other   . Diabetes Other   . ADD / ADHD Other   . Schizophrenia Father   . Drug abuse Father   . Bipolar disorder Mother   . Depression Mother   . Anxiety disorder Sister   . Anxiety disorder Brother   . Physical abuse Brother   . OCD Other   . Anxiety disorder Other     Social History Social History   Tobacco Use  . Smoking status: Never Smoker  . Smokeless tobacco: Never Used  Substance Use Topics  . Alcohol use: No  . Drug use: No     Allergies    Ceftriaxone sodium, Lorazepam, and Prednisone   Review of Systems Review of Systems  Constitutional: Negative.   HENT: Positive for sinus pressure, sinus pain and sore throat.   Respiratory: Negative.   Cardiovascular: Negative.   Gastrointestinal: Negative.   Neurological: Positive for headaches.  ROS:All other are negatives   Physical Exam Triage Vital Signs ED Triage Vitals  Enc Vitals Group     BP 12/18/19 1227 98/62     Pulse Rate 12/18/19 1227 76     Resp 12/18/19 1227 18     Temp 12/18/19 1227 98.7 F (37.1 C)     Temp src --      SpO2 12/18/19 1227 95 %     Weight --      Height --      Head Circumference --      Peak Flow --      Pain Score 12/18/19 1225 3     Pain Loc --      Pain Edu? --      Excl. in [place]? --    No data found.  Updated Vital Signs BP 98/62   Pulse 76   Temp 98.7 F (37.1 C)   Resp 18   SpO2 95%   Visual Acuity Right Eye Distance:   Left Eye Distance:   Bilateral Distance:    Right Eye Near:   Left Eye Near:    Bilateral Near:     Physical Exam Constitutional:      General: She is not in acute distress.    Appearance: Normal appearance. She is normal weight. She is not ill-appearing or toxic-appearing.  HENT:     Head: Normocephalic.     Right Ear: Tympanic membrane, ear canal and external ear normal. There is no impacted cerumen.     Left Ear: Ear canal and external ear normal. There is no impacted cerumen.     Nose: Nose normal. No congestion.     Mouth/Throat:     Mouth: Mucous membranes are moist.     Pharynx: No oropharyngeal exudate or posterior oropharyngeal erythema.     Tonsils: No tonsillar exudate or tonsillar abscesses. 1+ on the right. 1+ on the left.  Cardiovascular:     Rate and Rhythm: Normal rate and regular rhythm.     Pulses: Normal pulses.     Heart sounds: Normal heart sounds. No murmur.  Pulmonary:     Effort: Pulmonary effort is normal. No respiratory distress.     Breath sounds: No wheezing or  rhonchi.  Chest:     Chest wall: No tenderness.  Abdominal:     General: Abdomen is flat.  Bowel sounds are normal. There is no distension.     Palpations: There is no mass.  Skin:    Capillary Refill: Capillary refill takes less than 2 seconds.  Neurological:     Mental Status: She is alert and oriented to person, place, and time.      UC Treatments / Results  Labs (all labs ordered are listed, but only abnormal results are displayed) Labs Reviewed  NOVEL CORONAVIRUS, NAA    EKG   Radiology No results found.  Procedures Procedures (including critical care time)  Medications Ordered in UC Medications - No data to display  Initial Impression / Assessment and Plan / UC Course  I have reviewed the triage vital signs and the nursing notes.  Pertinent labs & imaging results that were available during my care of the patient were reviewed by me and considered in my medical decision making (see chart for details).   Patient stable for discharge.  Benign physical exam.  Covid test was completed at this visit.  Advised patient to quarantine until results become available.Patient declined medication to be prescribed. Advised patient to use OTC Tylenol  for pain.  To go to emergency department for worsening symptoms . Final Clinical Impressions(s) / UC Diagnoses   Final diagnoses:  Suspected COVID-19 virus infection     Discharge Instructions     COVID testing ordered.  It will take between 2-7 days for test results.  Someone will contact you regarding abnormal results.    In the meantime: You should remain isolated in your home for 10 days from symptom onset  Get plenty of rest and push fluids Use medications daily for symptom relief Use OTC medications like ibuprofen or tylenol as needed fever or pain Call or go to the ED if you have any new or worsening symptoms such as fever, worsening cough, shortness of breath, chest tightness, chest pain, turning blue, changes in  mental status, etc...     ED Prescriptions    None     PDMP not reviewed this encounter.   Emerson Monte, [place] 12/18/19 1335

## 2019-12-18 NOTE — ED Triage Notes (Signed)
Pt presents with c/o headache and sinus symptoms rthat began last night, pts son tested positive but she has only had secondary exposure

## 2019-12-18 NOTE — Discharge Instructions (Signed)

## 2019-12-20 LAB — NOVEL CORONAVIRUS, NAA: SARS-CoV-2, NAA: NOT DETECTED

## 2019-12-21 ENCOUNTER — Ambulatory Visit: Payer: PPO | Admitting: Adult Health

## 2019-12-27 ENCOUNTER — Other Ambulatory Visit: Payer: Self-pay

## 2019-12-27 ENCOUNTER — Encounter: Payer: Self-pay | Admitting: Adult Health

## 2019-12-27 ENCOUNTER — Ambulatory Visit (INDEPENDENT_AMBULATORY_CARE_PROVIDER_SITE_OTHER): Payer: PPO | Admitting: Adult Health

## 2019-12-27 DIAGNOSIS — F411 Generalized anxiety disorder: Secondary | ICD-10-CM | POA: Diagnosis not present

## 2019-12-27 DIAGNOSIS — F332 Major depressive disorder, recurrent severe without psychotic features: Secondary | ICD-10-CM | POA: Diagnosis not present

## 2019-12-27 DIAGNOSIS — G4700 Insomnia, unspecified: Secondary | ICD-10-CM

## 2019-12-27 DIAGNOSIS — F429 Obsessive-compulsive disorder, unspecified: Secondary | ICD-10-CM

## 2019-12-27 DIAGNOSIS — F410 Panic disorder [episodic paroxysmal anxiety] without agoraphobia: Secondary | ICD-10-CM | POA: Diagnosis not present

## 2019-12-27 DIAGNOSIS — F4310 Post-traumatic stress disorder, unspecified: Secondary | ICD-10-CM | POA: Diagnosis not present

## 2019-12-27 MED ORDER — CLONAZEPAM 0.5 MG PO TABS: 0.5000 mg | ORAL_TABLET | Freq: Two times a day (BID) | ORAL | 2 refills | Status: DC | PRN

## 2019-12-27 NOTE — Progress Notes (Signed)
ROSHUNDA LOVELESS [id number] March 30, 1971 48 y.o.  Subjective:   Patient ID:  [name] is a 48 y.o. (DOB September 14, 1971) female.  Chief Complaint: No chief complaint on file.   HPI [name] presents to the office today for follow-up of anxiety, depression, panic attacks, PTSD, OCD, and insomnia.  Describes mood today as "ok". Pleasant. Nervous. Mood symptoms - reports depression, anxiety, and irritability. More anxious overall. Stating "I don't feel good at all". Son and his family moved in July. Son with multiple health issues - in [place]. Wife staying with him. Has 2 granddaughtes 58 and 49 year old staying with her. Increased anxiety. Stating "I get so nervous, I throw up". Can't get genetic testing done until January. Feels like [name] needs something for her nerves. Has tried Xanax and Clonazepam in the past. Having panic attacks. Throwing up. Stating "I'm no good for anybody right now". Struggles with depression at times. Anxiety is worse - "I can't get it together". Stable interest and motivation. Taking medications as prescribed.  Energy levels decreased. Active, does not have a regular exercise routine with physical disabilities. Disabled 15 years.  Enjoys some usual interests and activities. Married. Lives with husband of 35 years. One son and his family living with them currently. Has 3 other sons.  Appetite adequate. Weight stable. Sleeping difficulties. Averages 3 to 4 hours. Waking up in a panic - having flashbacks from childhood. Focus and concentration "not good". Completing task. Managing aspects of household.  Denies SI or HI. Denies AH or VH.  ECT 2009 - did well for her, but memory was bad.   Previous medications: Lamictal - rash, Geodon, Zyprexa, Ativan, Prozac, Lexapro, Xanax, Clonazepam, Xanax   Review of Systems:  Review of Systems  Musculoskeletal: Negative for gait problem.  Neurological: Negative for tremors.  Psychiatric/Behavioral:       Please refer  to HPI    Medications: I have reviewed the patient's current medications.  Current Outpatient Medications  Medication Sig Dispense Refill  . albuterol (PROVENTIL HFA;VENTOLIN HFA) 108 (90 Base) MCG/ACT inhaler Inhale 2 puffs into the lungs every 4 (four) hours as needed for up to 30 days for wheezing or shortness of breath. 1 Inhaler 3  . buPROPion (WELLBUTRIN SR) 150 MG 12 hr tablet Take 1 tablet (150 mg total) by mouth every morning. 90 tablet 1  . calcium carbonate (OS-CAL) 600 MG TABS Take 600 mg by mouth daily.      . cholecalciferol (VITAMIN D) 1000 UNITS tablet Take 1,000 Units by mouth daily.      . clonazePAM (KLONOPIN) 0.5 MG tablet Take 1 tablet (0.5 mg total) by mouth 2 (two) times daily as needed for anxiety. 60 tablet 2  . diclofenac (VOLTAREN) 75 MG EC tablet TAKE 1 TABLET BY MOUTH EVERY 12 HOURS WITH FOOD AS NEEDED    . FOLIC ACID PO Take by mouth daily. Take 2 mg     . montelukast (SINGULAIR) 10 MG tablet Take 10 mg by mouth daily.    . propranolol (INDERAL) 10 MG tablet Take 10 mg by mouth 3 (three) times daily as needed.     . sertraline (ZOLOFT) 100 MG tablet Take 2 tablets (200 mg total) by mouth daily. 180 tablet 1  . tiZANidine (ZANAFLEX) 4 MG tablet Take 4 mg by mouth 3 (three) times daily as needed.     No current facility-administered medications for this visit.    Medication Side Effects: None  Allergies:  Allergies  Allergen Reactions  . Ceftriaxone Sodium     REACTION: unspecified  . Lorazepam     REACTION: unspecified  . Prednisone     Past Medical History:  Diagnosis Date  . ADHD (attention deficit hyperactivity disorder)   . Anxiety    ptsd  . Depression    with psychosis, bipolar, ect therapy  . Goiter    multinodualr  . Herniated disc, cervical 04/2017   Has 2 herniated discs  . OCD (obsessive compulsive disorder)   . Suicidal behavior     Family History  Problem Relation Age of Onset  . Arthritis Other   . Diabetes Other   . ADD  / ADHD Other   . Schizophrenia Father   . Drug abuse Father   . Bipolar disorder Mother   . Depression Mother   . Anxiety disorder Sister   . Anxiety disorder Brother   . Physical abuse Brother   . OCD Other   . Anxiety disorder Other     Social History   Socioeconomic History  . Marital status: Married    Spouse name: Not on file  . Number of children: Not on file  . Years of education: Not on file  . Highest education level: Not on file  Occupational History  . Not on file  Tobacco Use  . Smoking status: Never Smoker  . Smokeless tobacco: Never Used  Substance and Sexual Activity  . Alcohol use: No  . Drug use: No  . Sexual activity: Yes    Partners: Male    Birth control/protection: Surgical  Other Topics Concern  . Not on file  Social History Narrative  . Not on file   Social Determinants of Health   Financial Resource Strain:   . Difficulty of Paying Living Expenses: Not on file  Food Insecurity:   . Worried About Charity fundraiser in the Last Year: Not on file  . Ran Out of Food in the Last Year: Not on file  Transportation Needs:   . Lack of Transportation (Medical): Not on file  . Lack of Transportation (Non-Medical): Not on file  Physical Activity:   . Days of Exercise per Week: Not on file  . Minutes of Exercise per Session: Not on file  Stress:   . Feeling of Stress : Not on file  Social Connections:   . Frequency of Communication with Friends and Family: Not on file  . Frequency of Social Gatherings with Friends and Family: Not on file  . Attends Religious Services: Not on file  . Active Member of Clubs or Organizations: Not on file  . Attends Archivist Meetings: Not on file  . Marital Status: Not on file  Intimate Partner Violence:   . Fear of Current or Ex-Partner: Not on file  . Emotionally Abused: Not on file  . Physically Abused: Not on file  . Sexually Abused: Not on file    Past Medical History, Surgical history, Social  history, and Family history were reviewed and updated as appropriate.   Please see review of systems for further details on the patient's review from today.   Objective:   Physical Exam:  There were no vitals taken for this visit.  Physical Exam Constitutional:      General: [name] is not in acute distress.    Appearance: [name] is well-developed.  Musculoskeletal:        General: No deformity.  Neurological:     Mental Status: [name] is alert  and oriented to person, place, and time.     Coordination: Coordination normal.  Psychiatric:        Attention and Perception: Attention and perception normal. [name] does not perceive auditory or visual hallucinations.        Mood and Affect: Mood is anxious. Mood is not depressed. Affect is not labile, blunt, angry or inappropriate.        Speech: Speech normal.        Behavior: Behavior normal.        Thought Content: Thought content normal. Thought content is not paranoid or delusional. Thought content does not include homicidal or suicidal ideation. Thought content does not include homicidal or suicidal plan.        Cognition and Memory: Cognition and memory normal.        Judgment: Judgment normal.     Comments: Insight intact     Lab Review:     Component Value Date/Time   NA 137 02/28/2019 0920   K 3.6 02/28/2019 0920   CL 106 02/28/2019 0920   CO2 23 02/28/2019 0920   GLUCOSE 102 (H) 02/28/2019 0920   BUN 14 02/28/2019 0920   CREATININE 0.65 02/28/2019 0920   CALCIUM 8.7 (L) 02/28/2019 0920   PROT 6.4 09/01/2007 1034   ALBUMIN 3.8 09/01/2007 1034   AST 10 09/01/2007 1034   ALT 11 09/01/2007 1034   ALKPHOS 31 (L) 09/01/2007 1034   BILITOT 0.7 09/01/2007 1034   GFRNONAA >60 02/28/2019 0920   GFRAA >60 02/28/2019 0920       Component Value Date/Time   WBC 5.1 02/28/2019 0920   RBC 4.49 02/28/2019 0920   HGB 12.6 02/28/2019 0920   HCT 39.7 02/28/2019 0920   PLT 202 02/28/2019 0920   MCV 88.4 02/28/2019 0920   MCH 28.1  02/28/2019 0920   MCHC 31.7 02/28/2019 0920   RDW 13.5 02/28/2019 0920   LYMPHSABS 1.8 02/28/2019 0920   MONOABS 0.4 02/28/2019 0920   EOSABS 0.4 02/28/2019 0920   BASOSABS 0.0 02/28/2019 0920    No results found for: POCLITH, LITHIUM   No results found for: PHENYTOIN, PHENOBARB, VALPROATE, CBMZ   .res Assessment: Plan:    Plan:  1. Wellbutrin SR 150mg   2. Zoloft 100mg  tablet - 2 daily 3. Propanolol 10mg  BID - doesn't take unless [name] has too. Makes her feel light headed and dizzy. BP currently running "low".  4. Add Clonazepam 0.5mg  BID   Will not add any new medications at this time per patient - would like to get genetic testing.   Genetic testing.  See therapist for anxiety.  Seen at [facility] - peer counseling.  Previousy seen by Dr. Daron Offer   RTC 4 weeks  Patient advised to contact office with any questions, adverse effects, or acute worsening in signs and symptoms.    Diagnoses and all orders for this visit:  Generalized anxiety disorder -     clonazePAM (KLONOPIN) 0.5 MG tablet; Take 1 tablet (0.5 mg total) by mouth 2 (two) times daily as needed for anxiety.  Severe recurrent major depression without psychotic features ([place])  Panic disorder without agoraphobia  Insomnia, unspecified type  PTSD (post-traumatic stress disorder)  Obsessive-compulsive disorder, unspecified type     Please see After Visit Summary for patient specific instructions.  Future Appointments  Date Time Provider [place]  12/30/2019 10:45 AM Wallene Huh, DPM [facility]  01/20/2020 10:15 AM Paulla Dolly, Tamala Fothergill, DPM [facility]  01/25/2020 10:40  AM Byan Poplaski, Berdie Ogren, NP [facility] None  01/31/2020  9:00 AM Cameron Sprang, MD [facility] None  02/03/2020  1:15 PM Wallene Huh, DPM [facility]  02/17/2020  9:15 AM Regal, Tamala Fothergill, DPM [facility]    No orders of the defined types were placed in this  encounter.   -------------------------------

## 2019-12-30 ENCOUNTER — Ambulatory Visit: Payer: PPO | Admitting: Podiatry

## 2019-12-31 HISTORY — PX: BUNIONECTOMY: SHX129

## 2020-01-06 ENCOUNTER — Ambulatory Visit: Payer: PPO | Admitting: Podiatry

## 2020-01-06 ENCOUNTER — Other Ambulatory Visit: Payer: Self-pay

## 2020-01-06 ENCOUNTER — Encounter: Payer: Self-pay | Admitting: Podiatry

## 2020-01-06 DIAGNOSIS — M2042 Other hammer toe(s) (acquired), left foot: Secondary | ICD-10-CM | POA: Diagnosis not present

## 2020-01-06 DIAGNOSIS — M2041 Other hammer toe(s) (acquired), right foot: Secondary | ICD-10-CM | POA: Diagnosis not present

## 2020-01-06 DIAGNOSIS — M21619 Bunion of unspecified foot: Secondary | ICD-10-CM | POA: Diagnosis not present

## 2020-01-06 NOTE — Patient Instructions (Signed)
Pre-Operative Instructions  Congratulations, you have decided to take an important step towards improving your quality of life.  You can be assured that the doctors and staff at [facility] will be with you every step of the way.  Here are some important things you should know:  1. Plan to be at the surgery center/hospital at least 1 (one) hour prior to your scheduled time, unless otherwise directed by the surgical center/hospital staff.  You must have a responsible adult accompany you, remain during the surgery and drive you home.  Make sure you have directions to the surgical center/hospital to ensure you arrive on time. 2. If you are having surgery at [facility] or [facility], you will need a copy of your medical history and physical form from your family physician within one month prior to the date of surgery. We will give you a form for your primary physician to complete.  3. We make every effort to accommodate the date you request for surgery.  However, there are times where surgery dates or times have to be moved.  We will contact you as soon as possible if a change in schedule is required.   4. No aspirin/ibuprofen for one week before surgery.  If you are on aspirin, any non-steroidal anti-inflammatory medications (Mobic, Aleve, Ibuprofen) should not be taken seven (7) days prior to your surgery.  You make take Tylenol for pain prior to surgery.  5. Medications - If you are taking daily heart and blood pressure medications, seizure, reflux, allergy, asthma, anxiety, pain or diabetes medications, make sure you notify the surgery center/hospital before the day of surgery so they can tell you which medications you should take or avoid the day of surgery. 6. No food or drink after midnight the night before surgery unless directed otherwise by surgical center/hospital staff. 7. No alcoholic beverages 24-hours prior to surgery.  No smoking 24-hours prior or 24-hours after  surgery. 8. Wear loose pants or shorts. They should be loose enough to fit over bandages, boots, and casts. 9. Don't wear slip-on shoes. Sneakers are preferred. 10. Bring your boot with you to the surgery center/hospital.  Also bring crutches or a walker if your physician has prescribed it for you.  If you do not have this equipment, it will be provided for you after surgery. 11. If you have not been contacted by the surgery center/hospital by the day before your surgery, call to confirm the date and time of your surgery. 12. Leave-time from work may vary depending on the type of surgery you have.  Appropriate arrangements should be made prior to surgery with your employer. 13. Prescriptions will be provided immediately following surgery by your doctor.  Fill these as soon as possible after surgery and take the medication as directed. Pain medications will not be refilled on weekends and must be approved by the doctor. 14. Remove nail polish on the operative foot and avoid getting pedicures prior to surgery. 15. Wash the night before surgery.  The night before surgery wash the foot and leg well with water and the antibacterial soap provided. Be sure to pay special attention to beneath the toenails and in between the toes.  Wash for at least three (3) minutes. Rinse thoroughly with water and dry well with a towel.  Perform this wash unless told not to do so by your physician.  Enclosed: 1 Ice pack (please put in freezer the night before surgery)   1 Hibiclens skin cleaner     Pre-op instructions  If you have any questions regarding the instructions, please do not hesitate to call our office.  [place]: [address], [place], [place] [address] -- [phone]  [place]: [address] [place], [place] [address] -- [phone]  [place]: [address], [place], [place] [address] -- [phone]   Website: [email or web address] 

## 2020-01-07 NOTE — Progress Notes (Signed)
Subjective:   Patient ID: [name], female   DOB: 49 y.o.   MRN: [id number]   HPI Patient presents for surgical consult for chronic bunion deformity left and hammertoe deformity of the fourth and fifth digits both feet that is been painful and making walking difficult.  Patient states that this has been an ongoing issue and gradually becoming more of an issue for her.   ROS      Objective:  Physical Exam  Neurovascular status was found to be intact with patient found to have bunion deformity left with redness pain around the joint surface and hammertoe deformity fourth and fifth digit bilateral and exostosis inside fifth digit right that is painful when pressed.  These of been chronic and gradually getting worse over time     Assessment:  Bunion deformity left hammertoe deformity with lesion formation bilateral     Plan:  H&P discussed condition and recommended structural bunion correction left along with hammertoe repair fourth and fifth digits with distal on the fifth right and also distal exostectomy digit 5 right.  I explained procedures risk patient wants surgery and after extensive review signed consent form understands all problems risk of procedures.  She is willing to accept risk wants surgery and signed consent form after extensive review I went ahead today and I did dispense air fracture walker for the left foot and leg due to the osteotomy that will be done and I want her to get used to this before the procedure.  She understands total recovery will take 6 months to 1 year

## 2020-01-10 ENCOUNTER — Telehealth: Payer: Self-pay | Admitting: *Deleted

## 2020-01-10 ENCOUNTER — Telehealth: Payer: Self-pay | Admitting: Podiatry

## 2020-01-10 MED ORDER — OXYCODONE-ACETAMINOPHEN 10-325 MG PO TABS: 1.0000 | ORAL_TABLET | ORAL | 0 refills | Status: DC | PRN

## 2020-01-10 MED ORDER — ONDANSETRON HCL 4 MG PO TABS: 4.0000 mg | ORAL_TABLET | Freq: Three times a day (TID) | ORAL | 0 refills | Status: DC | PRN

## 2020-01-10 NOTE — Telephone Encounter (Signed)
[facility] - Juliann Pulse wanted to make sure Dr. Paulla Dolly knew pt was taking clonazepam. I told Juliann Pulse that it was in our records and viewed by Dr. Paulla Dolly.

## 2020-01-10 NOTE — Telephone Encounter (Addendum)
DOS: 01/11/2020  SURGICAL PROCEDURES: Altamese Paauilo [id number]), Hammertoe Repair 4th Bilateral, 5th Distal Right, and 5th [phone]), and Exostectomy 5th [phone])  [employer] Effective: 12/31/2019 -  Prior Authorization# [id number] valid from 01/11/2020 - 04/10/2020 for the following:  1 Sx CPT code 28296 2 Sx CPT code [id number] 1 Sx CPT code 28108  Prior Authorization# [id number] valid from 01/11/2020 - 04/10/2020 for  4 Sx CPT code 28285.

## 2020-01-10 NOTE — Addendum Note (Signed)
Addended by: Wallene Huh on: 01/10/2020 01:35 PM   Modules accepted: Orders

## 2020-01-11 ENCOUNTER — Encounter: Payer: Self-pay | Admitting: Podiatry

## 2020-01-11 DIAGNOSIS — M25774 Osteophyte, right foot: Secondary | ICD-10-CM | POA: Diagnosis not present

## 2020-01-11 DIAGNOSIS — J45909 Unspecified asthma, uncomplicated: Secondary | ICD-10-CM | POA: Diagnosis not present

## 2020-01-11 DIAGNOSIS — M2041 Other hammer toe(s) (acquired), right foot: Secondary | ICD-10-CM | POA: Diagnosis not present

## 2020-01-11 DIAGNOSIS — M2012 Hallux valgus (acquired), left foot: Secondary | ICD-10-CM | POA: Diagnosis not present

## 2020-01-11 DIAGNOSIS — M2042 Other hammer toe(s) (acquired), left foot: Secondary | ICD-10-CM | POA: Diagnosis not present

## 2020-01-11 DIAGNOSIS — M85871 Other specified disorders of bone density and structure, right ankle and foot: Secondary | ICD-10-CM | POA: Diagnosis not present

## 2020-01-18 ENCOUNTER — Telehealth: Payer: Self-pay | Admitting: *Deleted

## 2020-01-18 MED ORDER — PROMETHAZINE HCL 25 MG PO TABS: 25.0000 mg | ORAL_TABLET | Freq: Four times a day (QID) | ORAL | 0 refills | Status: DC | PRN

## 2020-01-18 NOTE — Telephone Encounter (Signed)
Pt called states she had surgery 01/11/2020 with Dr. Paulla Dolly and is having problems with nausea and the zofran is not helping has used phenergan and it works better. Dr. Josephina Shih the phenergan orders.

## 2020-01-20 ENCOUNTER — Encounter: Payer: Self-pay | Admitting: Podiatry

## 2020-01-20 ENCOUNTER — Other Ambulatory Visit: Payer: Self-pay

## 2020-01-20 ENCOUNTER — Ambulatory Visit (INDEPENDENT_AMBULATORY_CARE_PROVIDER_SITE_OTHER): Payer: PPO | Admitting: Podiatry

## 2020-01-20 ENCOUNTER — Ambulatory Visit (INDEPENDENT_AMBULATORY_CARE_PROVIDER_SITE_OTHER): Payer: PPO

## 2020-01-20 VITALS — BP 120/74 | HR 59 | Temp 96.4°F

## 2020-01-20 DIAGNOSIS — M2041 Other hammer toe(s) (acquired), right foot: Secondary | ICD-10-CM

## 2020-01-20 DIAGNOSIS — M2042 Other hammer toe(s) (acquired), left foot: Secondary | ICD-10-CM | POA: Diagnosis not present

## 2020-01-20 DIAGNOSIS — M21619 Bunion of unspecified foot: Secondary | ICD-10-CM

## 2020-01-20 NOTE — Progress Notes (Signed)
Subjective:   Patient ID: [name], female   DOB: 49 y.o.   MRN: [id number]   HPI Patient states she is doing well with surgery but is having some discomfort between her toes.  Overall very pleased   ROS      Objective:  Physical Exam  Neurovascular status intact with patient's feet healing well wound edges well coapted digits in good alignment with first MPJ left healthy.  Patient has negative Bevelyn Buckles' sign bilateral noted     Assessment:  Doing well post osteotomy left digital correction bilateral F2     Plan:  H&P reviewed both conditions and at this point reapplied sterile dressing left advised to continue compression elevation immobilization and reappoint suture removal or earlier if needed  X-rays indicate osteotomy left is healing very well with good alignment of the fourth and fifth digits bilateral

## 2020-01-25 ENCOUNTER — Encounter: Payer: Self-pay | Admitting: Adult Health

## 2020-01-25 ENCOUNTER — Ambulatory Visit (INDEPENDENT_AMBULATORY_CARE_PROVIDER_SITE_OTHER): Payer: PPO | Admitting: Adult Health

## 2020-01-25 DIAGNOSIS — G4700 Insomnia, unspecified: Secondary | ICD-10-CM

## 2020-01-25 DIAGNOSIS — F4310 Post-traumatic stress disorder, unspecified: Secondary | ICD-10-CM

## 2020-01-25 DIAGNOSIS — F410 Panic disorder [episodic paroxysmal anxiety] without agoraphobia: Secondary | ICD-10-CM | POA: Diagnosis not present

## 2020-01-25 DIAGNOSIS — F429 Obsessive-compulsive disorder, unspecified: Secondary | ICD-10-CM | POA: Diagnosis not present

## 2020-01-25 DIAGNOSIS — F411 Generalized anxiety disorder: Secondary | ICD-10-CM | POA: Diagnosis not present

## 2020-01-25 DIAGNOSIS — F332 Major depressive disorder, recurrent severe without psychotic features: Secondary | ICD-10-CM | POA: Diagnosis not present

## 2020-01-25 NOTE — Progress Notes (Signed)
[name] [id number] [birth year] 49 y.o.  Virtual Visit via Telephone Note  I connected with pt on 01/25/20 at 10:40 AM EST by telephone and verified that I am speaking with the correct person using two identifiers.   I discussed the limitations, risks, security and privacy concerns of performing an evaluation and management service by telephone and the availability of in person appointments. I also discussed with the patient that there may be a patient responsible charge related to this service. The patient expressed understanding and agreed to proceed.   I discussed the assessment and treatment plan with the patient. The patient was provided an opportunity to ask questions and all were answered. The patient agreed with the plan and demonstrated an understanding of the instructions.   The patient was advised to call back or seek an in-person evaluation if the symptoms worsen or if the condition fails to improve as anticipated.  I provided 30 minutes of non-face-to-face time during this encounter.  The patient was located at home.  The provider was located at [place].   Aloha Gell, NP   Subjective:   Patient ID:  [name] is a 49 y.o. (DOB [birth year]) female.  Chief Complaint:  Chief Complaint  Patient presents with  . Anxiety  . Depression  . Insomnia  . Panic Attack  . Sleeping Problem  . Trauma    HPI   [name] presents for follow-up of anxiety, depression, panic attacks, PTSD, OCD, and insomnia.  Describes mood today as "ok". Pleasant. Mood symptoms - reports decreased depression, anxiety, and irritability. Reports some "panic attacks". Some "throwing up, but not as much". Feels like the Clonazepam has been "very helpful". Has not gotten depressed since taking the Clonazepam. Stating "it did last time". Recent surgery on both of her feet at the same time. Has "done well" through it. Plans to get "genetic testing" done upcoming. Stable  interest and motivation. Taking medications as prescribed.  Energy levels "better". Active, does not have a regular exercise routine with physical disabilities. Disabled 15 years.  Enjoys some usual interests and activities. Married. Lives with husband of 14 years. One son and his family living with them currently. Has another son staying "on and off some". Has 2 other sons.  Appetite adequate - nauseous since surgery. Weight stable. Sleeping better some nights than others. Averages 6 to 7 hours with clonazepam.  Focus and concentration "about the same". Completing task. Managing aspects of household - "a few little things".  Denies SI or HI. Denies AH or VH.  ECT 2009 - did well for her, but memory was bad.   Previous medications: Lamictal - rash, Geodon, Zyprexa, Ativan, Prozac, Lexapro, Xanax, Clonazepam, Xanax   Review of Systems:  Review of Systems  Musculoskeletal: Negative for gait problem.  Neurological: Negative for tremors.  Psychiatric/Behavioral:       Please refer to HPI    Medications: I have reviewed the patient's current medications.  Current Outpatient Medications  Medication Sig Dispense Refill  . albuterol (PROVENTIL HFA;VENTOLIN HFA) 108 (90 Base) MCG/ACT inhaler Inhale 2 puffs into the lungs every 4 (four) hours as needed for up to 30 days for wheezing or shortness of breath. 1 Inhaler 3  . buPROPion (WELLBUTRIN SR) 150 MG 12 hr tablet Take 1 tablet (150 mg total) by mouth every morning. 90 tablet 1  . calcium carbonate (OS-CAL) 600 MG TABS Take 600 mg by mouth daily.      . cholecalciferol (  VITAMIN D) 1000 UNITS tablet Take 1,000 Units by mouth daily.      . clonazePAM (KLONOPIN) 0.5 MG tablet Take 1 tablet (0.5 mg total) by mouth 2 (two) times daily as needed for anxiety. 60 tablet 2  . diclofenac (VOLTAREN) 75 MG EC tablet TAKE 1 TABLET BY MOUTH EVERY 12 HOURS WITH FOOD AS NEEDED    . montelukast (SINGULAIR) 10 MG tablet Take 10 mg by mouth daily.    Marland Kitchen  oxyCODONE-acetaminophen (PERCOCET) 10-325 MG tablet Take 1 tablet by mouth every 4 (four) hours as needed for pain. (Patient not taking: Reported on 01/20/2020) 30 tablet 0  . promethazine (PHENERGAN) 25 MG tablet Take 1 tablet (25 mg total) by mouth every 6 (six) hours as needed for nausea or vomiting. 30 tablet 0  . sertraline (ZOLOFT) 100 MG tablet Take 2 tablets (200 mg total) by mouth daily. 180 tablet 1  . tiZANidine (ZANAFLEX) 4 MG tablet Take 4 mg by mouth 3 (three) times daily as needed.     No current facility-administered medications for this visit.    Medication Side Effects: None  Allergies:  Allergies  Allergen Reactions  . Ceftriaxone Sodium     REACTION: unspecified  . Lorazepam     REACTION: unspecified  . Prednisone     Past Medical History:  Diagnosis Date  . ADHD (attention deficit hyperactivity disorder)   . Anxiety    ptsd  . Depression    with psychosis, bipolar, ect therapy  . Goiter    multinodualr  . Herniated disc, cervical 04/2017   Has 2 herniated discs  . OCD (obsessive compulsive disorder)   . Suicidal behavior     Family History  Problem Relation Age of Onset  . Arthritis Other   . Diabetes Other   . ADD / ADHD Other   . Schizophrenia Father   . Drug abuse Father   . Bipolar disorder Mother   . Depression Mother   . Anxiety disorder Sister   . Anxiety disorder Brother   . Physical abuse Brother   . OCD Other   . Anxiety disorder Other     Social History   Socioeconomic History  . Marital status: Married    Spouse name: Not on file  . Number of children: Not on file  . Years of education: Not on file  . Highest education level: Not on file  Occupational History  . Not on file  Tobacco Use  . Smoking status: Never Smoker  . Smokeless tobacco: Never Used  Substance and Sexual Activity  . Alcohol use: No  . Drug use: No  . Sexual activity: Yes    Partners: Male    Birth control/protection: Surgical  Other Topics Concern   . Not on file  Social History Narrative  . Not on file   Social Determinants of Health   Financial Resource Strain:   . Difficulty of Paying Living Expenses: Not on file  Food Insecurity:   . Worried About Charity fundraiser in the Last Year: Not on file  . Ran Out of Food in the Last Year: Not on file  Transportation Needs:   . Lack of Transportation (Medical): Not on file  . Lack of Transportation (Non-Medical): Not on file  Physical Activity:   . Days of Exercise per Week: Not on file  . Minutes of Exercise per Session: Not on file  Stress:   . Feeling of Stress : Not on file  Social Connections:   .  Frequency of Communication with Friends and Family: Not on file  . Frequency of Social Gatherings with Friends and Family: Not on file  . Attends Religious Services: Not on file  . Active Member of Clubs or Organizations: Not on file  . Attends Archivist Meetings: Not on file  . Marital Status: Not on file  Intimate Partner Violence:   . Fear of Current or Ex-Partner: Not on file  . Emotionally Abused: Not on file  . Physically Abused: Not on file  . Sexually Abused: Not on file    Past Medical History, Surgical history, Social history, and Family history were reviewed and updated as appropriate.   Please see review of systems for further details on the patient's review from today.   Objective:   Physical Exam:  There were no vitals taken for this visit.  Physical Exam Constitutional:      General: She is not in acute distress.    Appearance: She is well-developed.  Musculoskeletal:        General: No deformity.  Neurological:     Mental Status: She is alert and oriented to person, place, and time.     Coordination: Coordination normal.  Psychiatric:        Attention and Perception: Attention and perception normal. She does not perceive auditory or visual hallucinations.        Mood and Affect: Mood normal. Mood is not anxious or depressed. Affect is  not labile, blunt, angry or inappropriate.        Speech: Speech normal.        Behavior: Behavior normal.        Thought Content: Thought content normal. Thought content is not paranoid or delusional. Thought content does not include homicidal or suicidal ideation. Thought content does not include homicidal or suicidal plan.        Cognition and Memory: Cognition and memory normal.        Judgment: Judgment normal.     Comments: Insight intact     Lab Review:     Component Value Date/Time   NA 137 02/28/2019 0920   K 3.6 02/28/2019 0920   CL 106 02/28/2019 0920   CO2 23 02/28/2019 0920   GLUCOSE 102 (H) 02/28/2019 0920   BUN 14 02/28/2019 0920   CREATININE 0.65 02/28/2019 0920   CALCIUM 8.7 (L) 02/28/2019 0920   PROT 6.4 09/01/2007 1034   ALBUMIN 3.8 09/01/2007 1034   AST 10 09/01/2007 1034   ALT 11 09/01/2007 1034   ALKPHOS 31 (L) 09/01/2007 1034   BILITOT 0.7 09/01/2007 1034   GFRNONAA >60 02/28/2019 0920   GFRAA >60 02/28/2019 0920       Component Value Date/Time   WBC 5.1 02/28/2019 0920   RBC 4.49 02/28/2019 0920   HGB 12.6 02/28/2019 0920   HCT 39.7 02/28/2019 0920   PLT 202 02/28/2019 0920   MCV 88.4 02/28/2019 0920   MCH 28.1 02/28/2019 0920   MCHC 31.7 02/28/2019 0920   RDW 13.5 02/28/2019 0920   LYMPHSABS 1.8 02/28/2019 0920   MONOABS 0.4 02/28/2019 0920   EOSABS 0.4 02/28/2019 0920   BASOSABS 0.0 02/28/2019 0920    No results found for: POCLITH, LITHIUM   No results found for: PHENYTOIN, PHENOBARB, VALPROATE, CBMZ   .res Assessment: Plan:    Plan:  1. Wellbutrin SR 150mg   2. Zoloft 100mg  tablet - 2 daily 3. Propanolol 10mg  BID - doesn't take unless she has too. Makes her feel light  headed and dizzy. BP currently running "low".  4. Clonazepam 0.5mg  BID   Will not add any new medications at this time per patient - would like to get genetic testing.   Genetic testing.  See therapist for anxiety.  Seen at [facility] - peer  counseling.  Previousy seen by Dr. Daron Offer   RTC 4 weeks  Patient advised to contact office with any questions, adverse effects, or acute worsening in signs and symptoms.  Discussed potential benefits, risk, and side effects of benzodiazepines to include potential risk of tolerance and dependence, as well as possible drowsiness.  Advised patient not to drive if experiencing drowsiness and to take lowest possible effective dose to minimize risk of dependence and tolerance.   Kember was seen today for anxiety, depression, insomnia, panic attack, sleeping problem and trauma.  Diagnoses and all orders for this visit:  Generalized anxiety disorder  Severe recurrent major depression without psychotic features ([place])  Panic disorder without agoraphobia  Insomnia, unspecified type  PTSD (post-traumatic stress disorder)  Obsessive-compulsive disorder, unspecified type    Please see After Visit Summary for patient specific instructions.  Future Appointments  Date Time Provider [place]  02/10/2020  9:15 AM Regal, Tamala Fothergill, DPM [facility]    No orders of the defined types were placed in this encounter.     -------------------------------

## 2020-01-31 ENCOUNTER — Ambulatory Visit: Payer: PPO | Admitting: Neurology

## 2020-02-03 ENCOUNTER — Encounter: Payer: PPO | Admitting: Podiatry

## 2020-02-10 ENCOUNTER — Ambulatory Visit (INDEPENDENT_AMBULATORY_CARE_PROVIDER_SITE_OTHER): Payer: PPO | Admitting: Podiatry

## 2020-02-10 ENCOUNTER — Ambulatory Visit (INDEPENDENT_AMBULATORY_CARE_PROVIDER_SITE_OTHER): Payer: PPO

## 2020-02-10 ENCOUNTER — Encounter: Payer: Self-pay | Admitting: Podiatry

## 2020-02-10 ENCOUNTER — Other Ambulatory Visit: Payer: Self-pay

## 2020-02-10 VITALS — BP 114/70 | HR 68 | Temp 97.2°F

## 2020-02-10 DIAGNOSIS — M2042 Other hammer toe(s) (acquired), left foot: Secondary | ICD-10-CM | POA: Diagnosis not present

## 2020-02-10 DIAGNOSIS — M2041 Other hammer toe(s) (acquired), right foot: Secondary | ICD-10-CM

## 2020-02-10 NOTE — Progress Notes (Signed)
Subjective:   Patient ID: [name], female   DOB: 49 y.o.   MRN: [id number]   HPI Patient states overall doing pretty well but still states feels a fullness between the right fourth and fifth digits and is here also for suture removal and is concerned about the bone   ROS      Objective:  Physical Exam  Neurovascular status intact with patient found to have good digital position bilateral with stitches in place and stitches between the fourth and fifth toes right along with the fifth digit and fourth digit bilateral and first metatarsal left     Assessment:  Normal feeling which I do think will go away gradually when swelling reduces and stitches are removed     Plan:  I went ahead and I removed all stitches at this time dispensed compression stocking for the left advised on range of motion gradual increase in activity levels and reappoint again in approximate 4 weeks.  Patient will be seen back prior if any issues were to occur  X-rays indicate satisfactory resection of bone first metatarsal left with good alignment of the fourth and fifth digits bilateral satisfactory bone resection

## 2020-02-17 ENCOUNTER — Encounter: Payer: PPO | Admitting: Podiatry

## 2020-03-01 ENCOUNTER — Telehealth: Payer: Self-pay | Admitting: *Deleted

## 2020-03-01 NOTE — Telephone Encounter (Signed)
Pt states she had surgery 12/2019 B/L bunion and hammer toes and at last visit, she informed Dr. Paulla Dolly of a hard spot between her toes and now has it on the left foot and both are painful.

## 2020-03-01 NOTE — Telephone Encounter (Signed)
I spoke with pt, she states the right foot is calloused like prior to the surgery and the left has a new callous and both are very painful, and she would like to see if she could get in to see Dr. Paulla Dolly prior her scheduled 03/09/2020 appt. I told pt I would have a scheduler call tomorrow.

## 2020-03-03 ENCOUNTER — Ambulatory Visit (INDEPENDENT_AMBULATORY_CARE_PROVIDER_SITE_OTHER): Payer: PPO | Admitting: Podiatry

## 2020-03-03 ENCOUNTER — Encounter: Payer: Self-pay | Admitting: Podiatry

## 2020-03-03 ENCOUNTER — Other Ambulatory Visit: Payer: Self-pay

## 2020-03-03 DIAGNOSIS — M2042 Other hammer toe(s) (acquired), left foot: Secondary | ICD-10-CM

## 2020-03-03 DIAGNOSIS — M2041 Other hammer toe(s) (acquired), right foot: Secondary | ICD-10-CM

## 2020-03-06 NOTE — Progress Notes (Signed)
Subjective:   Patient ID: [name], female   DOB: 49 y.o.   MRN: [id number]   HPI Patient presents concerned about swelling to the fourth digit right foot and states it feels like the fourth and fifth toes are stuck together and there is a lesion on the fourth toe which can be irritative to her.  States everything else is feeling good and the bunion site is doing very well left   ROS      Objective:  Physical Exam  Neurovascular status intact negative Bevelyn Buckles' sign noted bilateral with patient having mild edema in the fourth digit right not inconsistent with this.  Postop with some irritation of tissue on the lateral side localized in nature.  Everything else is healing well wound edges well coapted     Assessment:  Probability that there is a little bit of excessive swelling to the right fourth digit with no drainage no redness no proximal edema erythema noted with lesion which may just need to be trimmed with all other areas healing well     Plan:  H&P reviewed condition and sterile debridement accomplished today which was tolerated well and I compress the digit to try to reduce any swelling of it and patient will be seen back to review.  Encouraged her to come in earlier if any concerns were to occur  X-rays indicate everything looks okay on x-ray was satisfactory section of bone bilateral

## 2020-03-09 ENCOUNTER — Encounter: Payer: PPO | Admitting: Podiatry

## 2020-03-21 DIAGNOSIS — Z87820 Personal history of traumatic brain injury: Secondary | ICD-10-CM

## 2020-03-21 HISTORY — DX: Personal history of traumatic brain injury: Z87.820

## 2020-03-22 ENCOUNTER — Other Ambulatory Visit: Payer: Self-pay | Admitting: Family Medicine

## 2020-03-22 DIAGNOSIS — Z1231 Encounter for screening mammogram for malignant neoplasm of breast: Secondary | ICD-10-CM

## 2020-03-24 ENCOUNTER — Other Ambulatory Visit: Payer: Self-pay | Admitting: Adult Health

## 2020-03-24 DIAGNOSIS — F410 Panic disorder [episodic paroxysmal anxiety] without agoraphobia: Secondary | ICD-10-CM

## 2020-03-24 DIAGNOSIS — F332 Major depressive disorder, recurrent severe without psychotic features: Secondary | ICD-10-CM

## 2020-03-24 DIAGNOSIS — F411 Generalized anxiety disorder: Secondary | ICD-10-CM

## 2020-03-27 DIAGNOSIS — S8010XA Contusion of unspecified lower leg, initial encounter: Secondary | ICD-10-CM | POA: Diagnosis not present

## 2020-03-27 DIAGNOSIS — S0033XA Contusion of nose, initial encounter: Secondary | ICD-10-CM | POA: Diagnosis not present

## 2020-03-28 ENCOUNTER — Emergency Department (INDEPENDENT_AMBULATORY_CARE_PROVIDER_SITE_OTHER)
Admission: EM | Admit: 2020-03-28 | Discharge: 2020-03-28 | Disposition: A | Discharge status: Home / Self Care | Payer: PPO | Source: Home / Self Care

## 2020-03-28 DIAGNOSIS — R519 Headache, unspecified: Secondary | ICD-10-CM

## 2020-03-28 DIAGNOSIS — W010XXD Fall on same level from slipping, tripping and stumbling without subsequent striking against object, subsequent encounter: Secondary | ICD-10-CM

## 2020-03-28 DIAGNOSIS — F0781 Postconcussional syndrome: Secondary | ICD-10-CM | POA: Diagnosis not present

## 2020-03-28 DIAGNOSIS — G44319 Acute post-traumatic headache, not intractable: Secondary | ICD-10-CM | POA: Diagnosis not present

## 2020-03-28 DIAGNOSIS — R110 Nausea: Secondary | ICD-10-CM

## 2020-03-28 DIAGNOSIS — S0990XD Unspecified injury of head, subsequent encounter: Secondary | ICD-10-CM

## 2020-03-28 DIAGNOSIS — M542 Cervicalgia: Secondary | ICD-10-CM | POA: Diagnosis not present

## 2020-03-28 MED ORDER — ONDANSETRON 4 MG PO TBDP
4.0000 mg | ORAL_TABLET | Freq: Once | ORAL | Status: AC
  Administered 2020-03-28: 4 mg via ORAL

## 2020-03-28 NOTE — Discharge Instructions (Addendum)
  You have declined EMS transport, however, due to the severity of your headache this evening, it is advised you allow your husband to drive you to the closest emergency department for further evaluation and treatment of your symptoms.
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
## 2020-03-28 NOTE — ED Provider Notes (Signed)
[facility]    CSN: [id number] Arrival date & time: 03/28/20  1735      History   Chief Complaint Chief Complaint  Patient presents with  . Near Syncope    HPI [name] is a 49 y.o. female.   HPI  [name] is a 49 y.o. female presenting to UC with her husband with c/o intermittent headaches since tripping and falling on her face at home 1 week ago. Pt denies LOC at that time but did have bleeding to her face and believes she broke her nose. She was seen by her PCP yesterday and advised to start using warm compresses instead of cold on her nose to help with her headaches.  She is scheduled to f/u with an ENT soon for her suspected nose fracture but about 1 hour PTA she developed a headache that has gradually worsened and is becoming severe. Associated nausea and photophobia.  Head pain is all over.  Mild dizziness. Pt reports feeling like she may pass out earlier while riding in the car with her husband.   Hx of vertigo in the past but this feels different to the pt. She is not on blood thinners.  Denies weakness or numbness in arms or legs Husband denies any episodes of confusion or slurred speech from the pt.    Past Medical History:  Diagnosis Date  . ADHD (attention deficit hyperactivity disorder)   . Anxiety    ptsd  . Depression    with psychosis, bipolar, ect therapy  . Goiter    multinodualr  . Herniated disc, cervical 04/2017   Has 2 herniated discs  . OCD (obsessive compulsive disorder)   . Suicidal behavior     Patient Active Problem List   Diagnosis Date Noted  . Asthma exacerbation 02/28/2019  . ADHD, predominantly inattentive type 09/05/2017  . Lumbar facet arthropathy 08/05/2017  . Spasm of lumbar paraspinous muscle 06/11/2017  . Lumbosacral disc herniation 06/02/2017  . Spinal stenosis of lumbar region with neurogenic claudication 05/07/2017  . Vascular insufficiency 05/07/2017  . Plantar fasciitis of left foot 01/09/2016  .  Bursitis of left shoulder 09/11/2015  . Fibromyalgia 01/10/2015  . Thyroid nodule 11/29/2014  . Pinched vertebral nerve 11/21/2014  . ACUTE SINUSITIS, UNSPECIFIED 02/08/2009  . ANXIETY DISORDER, GENERALIZED 09/08/2008  . HYPERTHYROIDISM 08/21/2008  . PALPITATIONS 08/21/2008  . CHEST PAIN 08/21/2008  . COCCYGEAL PAIN 12/29/2007  . CYSTITIS, ACUTE 10/06/2007  . GOITER NOS 09/01/2007  . ANEMIA-NOS 09/01/2007  . Depression 09/01/2007  . ASTHMA 09/01/2007  . SYNCOPE 09/01/2007    Past Surgical History:  Procedure Laterality Date  . ABDOMINAL HYSTERECTOMY    . CHOLECYSTECTOMY    . CRYOTHERAPY     cervix  . TUBAL LIGATION      OB History   No obstetric history on file.      Home Medications    Prior to Admission medications   Medication Sig Start Date End Date Taking? Authorizing Provider  albuterol (PROVENTIL HFA;VENTOLIN HFA) 108 (90 Base) MCG/ACT inhaler Inhale 2 puffs into the lungs every 4 (four) hours as needed for up to 30 days for wheezing or shortness of breath. 03/01/19 03/31/19  Manuella Ghazi, Pratik D, DO  buPROPion (WELLBUTRIN SR) 150 MG 12 hr tablet Take 1 tablet (150 mg total) by mouth every morning. 10/19/19   Mozingo, Berdie Ogren, NP  calcium carbonate (OS-CAL) 600 MG TABS Take 600 mg by mouth daily.      [provider]  cholecalciferol (VITAMIN D) 1000 UNITS tablet Take 1,000 Units by mouth daily.      [provider]  clonazePAM (KLONOPIN) 0.5 MG tablet Take 1 tablet (0.5 mg total) by mouth 2 (two) times daily as needed for anxiety. 12/27/19   Mozingo, Berdie Ogren, NP  diclofenac (VOLTAREN) 75 MG EC tablet TAKE 1 TABLET BY MOUTH EVERY 12 HOURS WITH FOOD AS NEEDED 09/08/19   [provider]  montelukast (SINGULAIR) 10 MG tablet Take 10 mg by mouth daily. 09/29/19   [provider]  oxyCODONE-acetaminophen (PERCOCET) 10-325 MG tablet Take 1 tablet by mouth every 4 (four) hours as needed for pain. Patient not taking: Reported on  02/10/2020 01/10/20   Wallene Huh, DPM  promethazine (PHENERGAN) 25 MG tablet Take 1 tablet (25 mg total) by mouth every 6 (six) hours as needed for nausea or vomiting. 01/18/20   Wallene Huh, DPM  sertraline (ZOLOFT) 100 MG tablet TAKE 2 TABLETS BY MOUTH EVERY DAY 03/24/20   Mozingo, Berdie Ogren, NP  tiZANidine (ZANAFLEX) 4 MG tablet Take 4 mg by mouth 3 (three) times daily as needed. 09/08/19   [provider]    Family History Family History  Problem Relation Age of Onset  . Arthritis Other   . Diabetes Other   . ADD / ADHD Other   . Schizophrenia Father   . Drug abuse Father   . Bipolar disorder Mother   . Depression Mother   . Anxiety disorder Sister   . Anxiety disorder Brother   . Physical abuse Brother   . OCD Other   . Anxiety disorder Other     Social History Social History   Tobacco Use  . Smoking status: Never Smoker  . Smokeless tobacco: Never Used  Substance Use Topics  . Alcohol use: No  . Drug use: No     Allergies   Ceftriaxone sodium, Lorazepam, and Prednisone   Review of Systems Review of Systems  Eyes: Positive for photophobia. Negative for visual disturbance.  Gastrointestinal: Positive for nausea. Negative for diarrhea and vomiting.  Musculoskeletal: Negative for neck pain and neck stiffness.  Neurological: Positive for dizziness, light-headedness and headaches.     Physical Exam Triage Vital Signs ED Triage Vitals  Enc Vitals Group     BP 03/28/20 1742 (!) 153/85     Pulse Rate 03/28/20 1742 85     Resp 03/28/20 1742 16     Temp 03/28/20 1742 97.9 F (36.6 C)     Temp Source 03/28/20 1742 Oral     SpO2 03/28/20 1742 100 %     Weight --      Height --      Head Circumference --      Peak Flow --      Pain Score 03/28/20 1741 8     Pain Loc --      Pain Edu? --      Excl. in [place]? --    No data found.  Updated Vital Signs BP (!) 153/85 (BP Location: Right Arm)   Pulse 85   Temp 97.9 F (36.6 C) (Oral)   Resp  16   SpO2 100%   Visual Acuity Right Eye Distance:   Left Eye Distance:   Bilateral Distance:    Right Eye Near:   Left Eye Near:    Bilateral Near:     Physical Exam Vitals and nursing note reviewed.  Constitutional:      Appearance: She is  well-developed.     Comments: Pt sitting in exam chair hugging a waste bin, appears nauseated and uncomfortable. Pt holding jacket to her face with other hand.   HENT:     Head: Normocephalic. Abrasion present.      Right Ear: Tympanic membrane and ear canal normal.     Left Ear: Tympanic membrane and ear canal normal.     Nose: Nasal deformity (mild at bridge of nose) and nasal tenderness ( over bridge of nose) present.     Right Nostril: No epistaxis or septal hematoma.     Left Nostril: No epistaxis or septal hematoma.     Right Sinus: Maxillary sinus tenderness and frontal sinus tenderness present.     Left Sinus: Maxillary sinus tenderness and frontal sinus tenderness present.     Mouth/Throat:     Lips: Pink.     Mouth: Mucous membranes are moist.     Pharynx: Oropharynx is clear. Uvula midline.  Eyes:     Extraocular Movements: Extraocular movements intact.     Conjunctiva/sclera: Conjunctivae normal.     Pupils: Pupils are equal, round, and reactive to light.     Comments: Photosensitive on exam  Neck:     Comments: No midline bone tenderness, no crepitus or step-offs.  Cardiovascular:     Rate and Rhythm: Normal rate and regular rhythm.  Pulmonary:     Effort: Pulmonary effort is normal. No respiratory distress.     Breath sounds: Normal breath sounds.  Musculoskeletal:        General: Normal range of motion.     Cervical back: Normal range of motion and neck supple. No tenderness.  Skin:    General: Skin is warm and dry.     Capillary Refill: Capillary refill takes less than 2 seconds.  Neurological:     General: No focal deficit present.     Mental Status: She is alert and oriented to person, place, and time.      Comments: Alert to person, place, time and event. Speech is clear. Able to follow 2 step commands. Did not ask pt to ambulate due to severe photophobia and reports of dizziness and nausea.   Psychiatric:        Behavior: Behavior normal.      UC Treatments / Results  Labs (all labs ordered are listed, but only abnormal results are displayed) Labs Reviewed - No data to display  EKG   Radiology No results found.  Procedures Procedures (including critical care time)  Medications Ordered in UC Medications  ondansetron (ZOFRAN-ODT) disintegrating tablet 4 mg (4 mg Oral Given 03/28/20 1755)    Initial Impression / Assessment and Plan / UC Course  I have reviewed the triage vital signs and the nursing notes.  Pertinent labs & imaging results that were available during my care of the patient were reviewed by me and considered in my medical decision making (see chart for details).    Pt kept jacket over her eyes during majority of exam, only removed when asked to examine her face. Due to severity of reported HA, photophobia and new nausea, unable to ambulate pt to assess gait, recommend further evaluation in emergency department. Pt declined EMS transport. Pt's husband feels comfortable driving pt POV to [facility] Emergency Department. Pt discharged in stable condition.  Final Clinical Impressions(s) / UC Diagnoses   Final diagnoses:  Severe headache  Nausea without vomiting  Fall from slip, trip, or stumble, subsequent encounter  Injury of  head, subsequent encounter     Discharge Instructions      You have declined EMS transport, however, due to the severity of your headache this evening, it is advised you allow your husband to drive you to the closest emergency department for further evaluation and treatment of your symptoms.     ED Prescriptions    None     PDMP not reviewed this encounter.   Noe Gens, [place] 03/28/20 1907

## 2020-04-04 ENCOUNTER — Other Ambulatory Visit: Payer: Self-pay

## 2020-04-04 ENCOUNTER — Ambulatory Visit (INDEPENDENT_AMBULATORY_CARE_PROVIDER_SITE_OTHER): Payer: PPO | Admitting: Otolaryngology

## 2020-04-04 VITALS — Temp 97.3°F

## 2020-04-04 DIAGNOSIS — S0033XA Contusion of nose, initial encounter: Secondary | ICD-10-CM | POA: Diagnosis not present

## 2020-04-04 DIAGNOSIS — J310 Chronic rhinitis: Secondary | ICD-10-CM | POA: Diagnosis not present

## 2020-04-04 DIAGNOSIS — J342 Deviated nasal septum: Secondary | ICD-10-CM | POA: Diagnosis not present

## 2020-04-04 NOTE — Progress Notes (Signed)
HPI: [name] is a 49 y.o. female who presents for evaluation of patient apparently fell on 03/21/2020 landing on her face.  Several days later she developed headaches some vision problems and some pain on the left side of her nose with left-sided nasal obstruction and was seen at [facility] in [place] where she had a CT scan and she was referred here for further evaluation of nasal complaints.  She still complains of pain on the left side of her nose.  When she fell she was wearing her glasses.  She has mild left-sided nasal congestion compared to the right.  She has been using Flonase which she is used in the past. I have been unable to review the CT scans in the office today at this time.  Past Medical History:  Diagnosis Date  . ADHD (attention deficit hyperactivity disorder)   . Anxiety    ptsd  . Depression    with psychosis, bipolar, ect therapy  . Goiter    multinodualr  . Herniated disc, cervical 04/2017   Has 2 herniated discs  . OCD (obsessive compulsive disorder)   . Suicidal behavior    Past Surgical History:  Procedure Laterality Date  . ABDOMINAL HYSTERECTOMY    . CHOLECYSTECTOMY    . CRYOTHERAPY     cervix  . TUBAL LIGATION     Social History   Socioeconomic History  . Marital status: Married    Spouse name: Not on file  . Number of children: Not on file  . Years of education: Not on file  . Highest education level: Not on file  Occupational History  . Not on file  Tobacco Use  . Smoking status: Never Smoker  . Smokeless tobacco: Never Used  Substance and Sexual Activity  . Alcohol use: No  . Drug use: No  . Sexual activity: Yes    Partners: Male    Birth control/protection: Surgical  Other Topics Concern  . Not on file  Social History Narrative  . Not on file   Social Determinants of Health   Financial Resource Strain:   . Difficulty of Paying Living Expenses:   Food Insecurity:   . Worried About Charity fundraiser in the Last  Year:   . Arboriculturist in the Last Year:   Transportation Needs:   . Film/video editor (Medical):   Marland Kitchen Lack of Transportation (Non-Medical):   Physical Activity:   . Days of Exercise per Week:   . Minutes of Exercise per Session:   Stress:   . Feeling of Stress :   Social Connections:   . Frequency of Communication with Friends and Family:   . Frequency of Social Gatherings with Friends and Family:   . Attends Religious Services:   . Active Member of Clubs or Organizations:   . Attends Archivist Meetings:   Marland Kitchen Marital Status:    Family History  Problem Relation Age of Onset  . Arthritis Other   . Diabetes Other   . ADD / ADHD Other   . Schizophrenia Father   . Drug abuse Father   . Bipolar disorder Mother   . Depression Mother   . Anxiety disorder Sister   . Anxiety disorder Brother   . Physical abuse Brother   . OCD Other   . Anxiety disorder Other    Allergies  Allergen Reactions  . Ceftriaxone Sodium     REACTION: unspecified  . Lorazepam  REACTION: unspecified  . Prednisone    Prior to Admission medications   Medication Sig Start Date End Date Taking? Authorizing Provider  buPROPion (WELLBUTRIN SR) 150 MG 12 hr tablet Take 1 tablet (150 mg total) by mouth every morning. 10/19/19  Yes Mozingo, Berdie Ogren, NP  calcium carbonate (OS-CAL) 600 MG TABS Take 600 mg by mouth daily.     Yes [provider]  cholecalciferol (VITAMIN D) 1000 UNITS tablet Take 1,000 Units by mouth daily.     Yes [provider]  clonazePAM (KLONOPIN) 0.5 MG tablet Take 1 tablet (0.5 mg total) by mouth 2 (two) times daily as needed for anxiety. 12/27/19  Yes Mozingo, Berdie Ogren, NP  diclofenac (VOLTAREN) 75 MG EC tablet TAKE 1 TABLET BY MOUTH EVERY 12 HOURS WITH FOOD AS NEEDED 09/08/19  Yes [provider]  montelukast (SINGULAIR) 10 MG tablet Take 10 mg by mouth daily. 09/29/19  Yes [provider]  oxyCODONE-acetaminophen  (PERCOCET) 10-325 MG tablet Take 1 tablet by mouth every 4 (four) hours as needed for pain. 01/10/20  Yes Regal, Tamala Fothergill, DPM  promethazine (PHENERGAN) 25 MG tablet Take 1 tablet (25 mg total) by mouth every 6 (six) hours as needed for nausea or vomiting. 01/18/20  Yes Regal, Tamala Fothergill, DPM  sertraline (ZOLOFT) 100 MG tablet TAKE 2 TABLETS BY MOUTH EVERY DAY 03/24/20  Yes Mozingo, Berdie Ogren, NP  tiZANidine (ZANAFLEX) 4 MG tablet Take 4 mg by mouth 3 (three) times daily as needed. 09/08/19  Yes [provider]  albuterol (PROVENTIL HFA;VENTOLIN HFA) 108 (90 Base) MCG/ACT inhaler Inhale 2 puffs into the lungs every 4 (four) hours as needed for up to 30 days for wheezing or shortness of breath. 03/01/19 03/31/19  Manuella Ghazi, Pratik D, DO     Positive ROS: Otherwise negative  All other systems have been reviewed and were otherwise negative with the exception of those mentioned in the HPI and as above.  Physical Exam: Constitutional: Alert, well-appearing, no acute distress Ears: External ears without lesions or tenderness. Ear canals are clear bilaterally with intact, clear TMs.  Nasal: External nose without lesions. Septum is mildly deviated to the left..  No evidence of septal hematoma.  Mild rhinitis and nasal congestion left side slightly worse than right..  Palpation of the left nasal bones is slightly tender but I do not appreciate any obvious fracture on palpation.  She has a small laceration of the left upper lip which is healing nicely with no signs of infection. Oral: Lips and gums without lesions. Tongue and palate mucosa without lesions. Posterior oropharynx clear. Neck: No palpable adenopathy or masses Respiratory: Breathing comfortably  Skin: No facial/neck lesions or rash noted.  I was able to review the CT scan of the face performed 2 weeks ago in [place].  This demonstrated no evidence of acute fracture of the nasal bones.  She did have moderate septal deviation to the left but  no evidence of acute fracture.  Procedures  Assessment: Recent nasal trauma with no evidence of acute fracture. Mild rhinitis. Septal deviation to the left.  Plan: Recommended use of nasal steroid spray Nasacort or Flonase 2 sprays each nostril at night as this will help with nasal congestion.  Radene Journey, MD

## 2020-04-10 ENCOUNTER — Other Ambulatory Visit: Payer: Self-pay

## 2020-04-10 ENCOUNTER — Ambulatory Visit
Admission: RE | Admit: 2020-04-10 | Discharge: 2020-04-10 | Disposition: A | Discharge status: Home / Self Care | Payer: PPO | Source: Ambulatory Visit | Attending: Family Medicine | Admitting: Family Medicine

## 2020-04-10 DIAGNOSIS — Z1231 Encounter for screening mammogram for malignant neoplasm of breast: Secondary | ICD-10-CM

## 2020-04-21 ENCOUNTER — Ambulatory Visit: Payer: PPO | Admitting: Podiatry

## 2020-05-04 ENCOUNTER — Encounter: Payer: Self-pay | Admitting: Podiatry

## 2020-05-04 ENCOUNTER — Ambulatory Visit: Payer: PPO | Admitting: Podiatry

## 2020-05-04 ENCOUNTER — Other Ambulatory Visit: Payer: Self-pay

## 2020-05-04 DIAGNOSIS — M21619 Bunion of unspecified foot: Secondary | ICD-10-CM

## 2020-05-07 NOTE — Progress Notes (Signed)
Subjective:   Patient ID: [name], female   DOB: 49 y.o.   MRN: [id number]   HPI Patient states she is feeling a lot better than she was and states she is back to wearing shoes again    ROS      Objective:  Physical Exam  Neurovascular status intact with patient found to have well-healed surgical sites fourth and fifth toes bilateral right first metatarsal with mild swelling still noted in the digits     Assessment:  Overall doing well with healing still finishing and swelling which is normal for postoperative.     Plan:  Reviewed the continuation of wider shoes range of motion exercises gradual return to normal activities normal shoes.  Patient will be seen back as needed  X-rays indicated osteotomy healing well good alignment noted good resection of bone bilateral

## 2020-05-08 DIAGNOSIS — J45901 Unspecified asthma with (acute) exacerbation: Secondary | ICD-10-CM | POA: Diagnosis not present

## 2020-05-08 DIAGNOSIS — Z20822 Contact with and (suspected) exposure to covid-19: Secondary | ICD-10-CM | POA: Diagnosis not present

## 2020-05-08 DIAGNOSIS — Z03818 Encounter for observation for suspected exposure to other biological agents ruled out: Secondary | ICD-10-CM | POA: Diagnosis not present

## 2020-05-08 DIAGNOSIS — Z20828 Contact with and (suspected) exposure to other viral communicable diseases: Secondary | ICD-10-CM | POA: Diagnosis not present

## 2020-07-12 ENCOUNTER — Ambulatory Visit: Payer: PPO | Admitting: Neurology

## 2020-08-01 ENCOUNTER — Other Ambulatory Visit: Payer: Self-pay | Admitting: Adult Health

## 2020-08-01 DIAGNOSIS — F410 Panic disorder [episodic paroxysmal anxiety] without agoraphobia: Secondary | ICD-10-CM

## 2020-08-01 DIAGNOSIS — F332 Major depressive disorder, recurrent severe without psychotic features: Secondary | ICD-10-CM

## 2020-08-01 DIAGNOSIS — F411 Generalized anxiety disorder: Secondary | ICD-10-CM

## 2020-08-04 ENCOUNTER — Encounter: Payer: Self-pay | Admitting: Psychology

## 2020-08-04 ENCOUNTER — Ambulatory Visit (INDEPENDENT_AMBULATORY_CARE_PROVIDER_SITE_OTHER): Payer: PPO | Admitting: Psychology

## 2020-08-04 ENCOUNTER — Other Ambulatory Visit: Payer: Self-pay

## 2020-08-04 ENCOUNTER — Ambulatory Visit: Payer: PPO | Admitting: Psychology

## 2020-08-04 DIAGNOSIS — R41840 Attention and concentration deficit: Secondary | ICD-10-CM

## 2020-08-04 DIAGNOSIS — F411 Generalized anxiety disorder: Secondary | ICD-10-CM | POA: Diagnosis not present

## 2020-08-04 DIAGNOSIS — F4310 Post-traumatic stress disorder, unspecified: Secondary | ICD-10-CM | POA: Insufficient documentation

## 2020-08-04 DIAGNOSIS — F330 Major depressive disorder, recurrent, mild: Secondary | ICD-10-CM | POA: Diagnosis not present

## 2020-08-04 DIAGNOSIS — R4189 Other symptoms and signs involving cognitive functions and awareness: Secondary | ICD-10-CM

## 2020-08-04 DIAGNOSIS — F429 Obsessive-compulsive disorder, unspecified: Secondary | ICD-10-CM | POA: Insufficient documentation

## 2020-08-04 NOTE — Progress Notes (Signed)
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 Memory Module, Form 1: Standard Score/ T Score Percentile   Total Memory Index 90 25 Average  List Learning       Total Trials 1-3 22/36 (50) 50 Average    List B 4/12 (50) 50 Average    Short Delay Free Recall 8/12 (54) 66 Average    Long Delay Free Recall 6/12 (46) 34 Average    Retention Percentage 75 (43) 25 Average    Recognition Discriminability 8 (48) 42 Average  Shape Learning       Total Trials 1-3 12/27 (41) 18 Below Average    Delayed Recall 5/9 (48) 42 Average    Retention Percentage 100 (50) 50 Average    Recognition Discriminability 7 (50) 50 Average  Story Learning       Immediate Recall 47/80 (38) 12 Below Average    Delayed Recall 28/40 (43) 25 Average    Retention Percentage 88 (48) 42 Average  Daily Living Memory       Immediate Recall 41/51 (49) 46 Average    Delayed Recall 12/17 (43) 25 Average    Retention Percentage 75 (35) 7 Well Below Average    Recognition Hits 10/10 (58) 79 Above Average       Attention/Executive Function:          Trail Making Test (TMT): Raw Score (T Score) Percentile     Part A 33 secs.,  0 errors (56) 73 Average     Part B 77 secs.,  0 errors (55) 69 Average         Scaled Score Percentile   WAIS-IV Coding: 7 16 Below Average       NAB Attention Module, Form 1: T Score Percentile     Digits Forward 63 91 Well Above Average    Digits Backwards 65 93 Well Above Average       D-KEFS Color-Word Interference Test: Raw Score (Scaled Score) Percentile     Color Naming 31 secs. (10) 50 Average    Word Reading 24 secs. (9) 37 Average    Inhibition 77 secs. (7) 16 Below Average      Total Errors 2 errors (9) 37 Average    Inhibition/Switching 89 secs. (7) 16 Below Average      Total Errors 1 error (11) 63 Average       D-KEFS Verbal Fluency Test: Raw Score (Scaled Score) Percentile     Letter Total Correct 24 (6) 9 Below Average    Category Total Correct 43 (12) 75 Above Average    Category Switching Total Correct 14 (11) 63 Average    Category Switching Accuracy  13 (11) 63 Average      Total Set Loss Errors 0 (13) 84 Above Average      Total Repetition Errors 3 (10) 50 Average       Language:          Verbal Fluency Test: Raw Score (T Score) Percentile     Phonemic Fluency (FAS) 24 (37) 9 Below Average    Animal Fluency 23 (61) 86 Above Average        NAB Language Module, Form 1: T Score Percentile     Naming 30/31 (55) 69 Average       Visuospatial/Visuoconstruction:      Raw Score Percentile   Clock Drawing: 8/10 --- Within Normal Limits       NAB Spatial Module, Form 1: T Score Percentile     Figure Drawing Copy 54 66 Average        Scaled Score Percentile   WAIS-IV Block Design: 6 9 Below Average  WAIS-IV Matrix Reasoning: 9 37 Average       Mood and Personality:      Raw Score Percentile   Beck Depression Inventory - II: 16 --- Mild  PROMIS Anxiety Questionnaire: 23 --- Moderate       Personality Assessment Inventory: T Score Percentile     Inconsistency 43 --- Within Normal Limits    Infrequency 63 --- Within Normal Limits    Negative Impression 59 --- Within Normal Limits     Positive Impression 52 --- Within Normal Limits    Somatic Complaints 56 --- Within Normal Limits    Anxiety 78 --- Elevated    Anxiety-Related Disorders 67 --- Within Normal Limits    Depression 62 --- Within Normal Limits    Mania 37 --- Within Normal Limits    Paranoia 41 --- Within Normal Limits    Schizophrenia 55 --- Within Normal Limits    Borderline Features 46 --- Within Normal Limits    Antisocial Features 39 --- Within Normal Limits    Alcohol Problems 41 --- Within Normal Limits    Drug Problems 48 --- Within Normal Limits    Aggression 36 --- Within Normal Limits    Suicidal Ideation 54 --- Within Normal Limits    Stress 48 --- Within Normal Limits    Non Support 42 --- Within Normal Limits    Treatment Rejection 38 --- Within Normal Limits    Dominance 27 --- Within Normal Limits    Warmth 44 --- Within Normal Limits       Additional Questionnaires:      Raw Score Percentile   PROMIS Sleep Disturbance Questionnaire: 19 --- None to Slight   Informed Consent and Coding/Compliance:   Ms. Freund was provided with a verbal description of the nature and purpose of the present neuropsychological evaluation. Also reviewed were the foreseeable risks and/or discomforts and benefits of the procedure, limits of confidentiality, and mandatory reporting requirements of this provider. The patient was given the opportunity to ask questions and receive answers about the evaluation. Oral consent to participate was provided by the patient.   This evaluation was conducted by Christia Reading, Ph.D., licensed clinical neuropsychologist. Ms. Belanger completed a comprehensive clinical interview with Dr. Melvyn Novas, billed as one unit [phone], and 195 minutes of cognitive testing and scoring, billed as one unit [phone] and six additional units [id number]. Psychometrist Milana Kidney, B.S., assisted Dr. Melvyn Novas with test administration and scoring procedures. As a separate and discrete service, Dr. Melvyn Novas spent a  total of 120 minutes in interpretation and report writing billed as one unit [phone] and one unit [phone].

## 2020-08-04 NOTE — Progress Notes (Signed)
   Psychometrician Note   Cognitive testing was administered to [name] by [name], B.S. (psychometrist) under the supervision of Dr. Christia [name], Ph.D., licensed psychologist on 08/04/20. [name] did not appear overtly distressed by the testing session per behavioral observation or responses across self-report questionnaires. Dr. Christia [name], Ph.D. checked in with [name] as needed to manage any distress related to testing procedures (if applicable). Rest breaks were offered.    The battery of tests administered was selected by Dr. Christia [name], Ph.D. with consideration to [name]'s current level of functioning, the nature of her symptoms, emotional and behavioral responses during interview, level of literacy, observed level of motivation/effort, and the nature of the referral question. This battery was communicated to the psychometrist. Communication between Dr. Christia [name], Ph.D. and the psychometrist was ongoing throughout the evaluation and Dr. Christia [name], Ph.D. was immediately accessible at all times. Dr. Christia [name], Ph.D. provided supervision to the psychometrist on the date of this service to the extent necessary to assure the quality of all services provided.    [name] will return within approximately 1-2 weeks for an interactive feedback session with Dr. Melvyn Novas at which time her test performances, clinical impressions, and treatment recommendations will be reviewed in detail. [name] understands she can contact our office should she require our assistance before this time.  A total of 195 minutes of billable time were spent face-to-face with [name] by the psychometrist. This includes both test administration and scoring time. Billing for these services is reflected in the clinical report generated by Dr. Christia [name], Ph.D..  This note reflects time spent with the psychometrician and does not include test scores or any clinical  interpretations made by Dr. Melvyn Novas. The full report will follow in a separate note.

## 2020-08-04 NOTE — Patient Instructions (Addendum)
Clinical Impression(s): [name] pattern of performance is suggestive of neuropsychological functioning within normal limits. Based upon premorbid intellectual estimations, performance across all domains (i.e., processing speed, attention/concentration, executive functioning, receptive and expressive language, visuospatial abilities, and learning and memory) were within appropriate normative ranges with no areas of identified relative weakness. [name] largely denied difficulties completing instrumental activities of daily living (ADLs) independently.  Across mood-related questionnaires, she reported acute symptoms of moderate anxiety and mild depression. Across a more comprehensive personality assessment, she also elevated the anxiety subscale. Her responses across the latter instrument suggested that she may be experiencing a discomforting level of anxiety and tension. She is likely plagued by worry to the degree that her ability to concentrate and attend are significantly compromised. Affectively, she likely experiences a great deal of tension, has difficulty relaxing, and likely experiences fatigue as a result of perceived high stress. Phobic behaviors are likely to interfere to a significant degree in her daily routine and she likely monitors her environment in an effort to avoid contact with a feared object or situation. She also reported ongoing symptoms of depression; however, this may be secondary to significant symptoms of anxiety. Regarding self-concept, she appears to be involved in negative self-evaluation. She may be self-critical, not handle setbacks well, or blame herself for past failures or lost opportunities.   Overall, the most likely culprit for perceived day-to-day cognitive dysfunction is ongoing psychiatric distress, especially anxiety-related symptoms. Ongoing anxiety particularly influences processing speed and attention/concentration. Difficulties in these areas likely creates  difficulties in other areas, especially executive functioning and learning and memory. Other psychiatric symptoms (i.e., depression and PTSD) further influence the same cognitive domains mentioned above. Mood symptoms could also exacerbate an already underlying dysregulated attentional network due to her history of ADHD. Specific to memory, [name] was able to learn novel verbal and visual information efficiently and retain this knowledge after lengthy delays. Overall, memory performance combined with intact performances across other areas of cognitive functioning is not suggestive of an early-onset neurodegenerative illness such as Alzheimer's disease. Continued medical monitoring will be important moving forward.

## 2020-08-08 ENCOUNTER — Encounter: Payer: PPO | Admitting: Psychology

## 2020-08-11 ENCOUNTER — Encounter: Payer: Self-pay | Admitting: Psychology

## 2020-08-11 ENCOUNTER — Ambulatory Visit (INDEPENDENT_AMBULATORY_CARE_PROVIDER_SITE_OTHER): Payer: PPO | Admitting: Psychology

## 2020-08-11 ENCOUNTER — Other Ambulatory Visit: Payer: Self-pay

## 2020-08-11 DIAGNOSIS — F4310 Post-traumatic stress disorder, unspecified: Secondary | ICD-10-CM

## 2020-08-11 DIAGNOSIS — F330 Major depressive disorder, recurrent, mild: Secondary | ICD-10-CM

## 2020-08-11 DIAGNOSIS — R41840 Attention and concentration deficit: Secondary | ICD-10-CM

## 2020-08-11 DIAGNOSIS — F411 Generalized anxiety disorder: Secondary | ICD-10-CM

## 2020-08-11 NOTE — Progress Notes (Signed)
   Neuropsychology Feedback Session Tillie Rung. [place] [facility] Department of Neurology  Reason for Referral:   MEADOW ABRAMO a 49 y.o. right-handed Caucasian female referred by Benjiman Core, PA,to characterize hercurrent cognitive functioning and assist with diagnostic clarity and treatment planning in the context of subjective cognitive decline and several psychiatric comorbidities.   Feedback:   Ms. Hausner completed a comprehensive neuropsychological evaluation on 08/04/2020. Please refer to that encounter for the full report and recommendations. Briefly, results suggested neuropsychological functioning within normal limits. Based upon premorbid intellectual estimations, performance across all domains (i.e., processing speed, attention/concentration, executive functioning, receptive and expressive language, visuospatial abilities, and learning and memory) were within appropriate normative ranges with no areas of identified relative weakness. Ms. Macfadden largely denied difficulties completing instrumental activities of daily living (ADLs) independently. Overall, the most likely culprit for perceived day-to-day cognitive dysfunction is ongoing psychiatric distress, especially anxiety-related symptoms. Ongoing anxiety particularly influences processing speed and attention/concentration. Difficulties in these areas likely creates difficulties in other areas, especially executive functioning and learning and memory. Other psychiatric symptoms (i.e., depression and PTSD) further influence the same cognitive domains mentioned above. Mood symptoms could also exacerbate an already underlying dysregulated attentional network due to her history of ADHD.  Ms. Pergola was unaccompanied during the current telephone call. She was within her residence while I was within my office. Content of the current session focused on the results of her neuropsychological evaluation. Ms. Sobieski was given the  opportunity to ask questions and her questions were answered. She was encouraged to reach out should additional questions arise. Her report is available to her on MyChart. Based upon our discussion, I will place a referral with [facility] for individual psychotherapy.      Less than 16 minutes were spent conducting the current feedback session with Ms. Dimas Millin.

## 2020-08-11 NOTE — Patient Instructions (Signed)
Recommendations: A combination of medication and psychotherapy has been shown to be most effective at treating symptoms of anxiety and depression. As such, [name] is encouraged to speak with her prescribing physician and/or psychiatrist regarding medication adjustments to optimally manage these symptoms. Likewise, [name] is encouraged to consider engaging in individual short-term psychotherapy to address symptoms of psychiatric distress. She would benefit from an active and collaborative therapeutic environment, rather than one purely supportive in nature. Recommended treatment modalities include Cognitive Behavioral Therapy (CBT) or Acceptance and Commitment Therapy (ACT). Individual therapy pursuits would be done in conjunction with the group-based services she is currently involved with.   [name] is encouraged to attend to lifestyle factors for brain health (e.g., regular physical exercise, good nutrition habits, regular participation in cognitively-stimulating activities, and general stress management techniques), which are likely to have benefits for both emotional adjustment and cognition. In fact, in addition to promoting good general health, regular exercise incorporating aerobic activities (e.g., brisk walking, jogging, cycling, etc.) has been demonstrated to be a very effective treatment for depression and stress, with similar efficacy rates to both antidepressant medication and psychotherapy. Optimal control of vascular risk factors (including safe cardiovascular exercise and adherence to dietary recommendations) is encouraged.   If interested, there are some activities which have therapeutic value and can be useful in keeping her cognitively stimulated. For suggestions, [name] is encouraged to go to the following website: https://www.[facility].org/get-to-know-barrow/centers-programs/neurorehabilitation-center/neuro-rehab-apps-and-games/ which has options, categorized by level of  difficulty. It should be noted that these activities should not be viewed as a substitute for therapy.  When learning new information, she would benefit from information being broken up into small, manageable pieces. She may also find it helpful to articulate the material in her own words and in a context to promote encoding at the onset of a new task. This material may need to be repeated multiple times to promote encoding.  Memory can be improved using internal strategies such as rehearsal, repetition, chunking, mnemonics, association, and imagery. External strategies such as written notes in a consistently used memory journal, visual and nonverbal auditory cues such as a calendar on the refrigerator or appointments with alarm, such as on a cell phone, can also help maximize recall.    To address problems with fluctuating attention, she may wish to consider:   -Avoiding external distractions when needing to concentrate   -Limiting exposure to fast paced environments with multiple sensory demands   -Writing down complicated information and using checklists   -Attempting and completing one task at a time (i.e., no multi-tasking)   -Verbalizing aloud each step of a task to maintain focus   -Reducing the amount of information considered at one time  Reducing anxiety may also aid in the retrieval of information and improve word-finding difficulties.

## 2020-08-15 ENCOUNTER — Encounter: Payer: PPO | Admitting: Psychology

## 2020-08-17 ENCOUNTER — Ambulatory Visit (INDEPENDENT_AMBULATORY_CARE_PROVIDER_SITE_OTHER): Payer: PPO | Admitting: Psychology

## 2020-08-17 DIAGNOSIS — F411 Generalized anxiety disorder: Secondary | ICD-10-CM | POA: Diagnosis not present

## 2020-08-30 ENCOUNTER — Ambulatory Visit: Payer: PPO | Admitting: Psychology

## 2020-09-11 ENCOUNTER — Ambulatory Visit (INDEPENDENT_AMBULATORY_CARE_PROVIDER_SITE_OTHER): Payer: PPO | Admitting: Psychology

## 2020-09-11 DIAGNOSIS — F411 Generalized anxiety disorder: Secondary | ICD-10-CM | POA: Diagnosis not present

## 2020-09-25 ENCOUNTER — Ambulatory Visit (INDEPENDENT_AMBULATORY_CARE_PROVIDER_SITE_OTHER): Payer: PPO | Admitting: Psychology

## 2020-09-25 DIAGNOSIS — F411 Generalized anxiety disorder: Secondary | ICD-10-CM

## 2020-09-30 ENCOUNTER — Other Ambulatory Visit: Payer: Self-pay | Admitting: Adult Health

## 2020-09-30 DIAGNOSIS — F332 Major depressive disorder, recurrent severe without psychotic features: Secondary | ICD-10-CM

## 2020-09-30 DIAGNOSIS — F410 Panic disorder [episodic paroxysmal anxiety] without agoraphobia: Secondary | ICD-10-CM

## 2020-09-30 DIAGNOSIS — F411 Generalized anxiety disorder: Secondary | ICD-10-CM

## 2020-10-09 ENCOUNTER — Ambulatory Visit: Payer: PPO | Admitting: Psychology

## 2020-12-12 DIAGNOSIS — Z13220 Encounter for screening for lipoid disorders: Secondary | ICD-10-CM | POA: Diagnosis not present

## 2020-12-12 DIAGNOSIS — E0590 Thyrotoxicosis, unspecified without thyrotoxic crisis or storm: Secondary | ICD-10-CM | POA: Diagnosis not present

## 2020-12-12 DIAGNOSIS — Z0000 Encounter for general adult medical examination without abnormal findings: Secondary | ICD-10-CM | POA: Diagnosis not present

## 2020-12-12 DIAGNOSIS — R002 Palpitations: Secondary | ICD-10-CM | POA: Diagnosis not present

## 2020-12-12 DIAGNOSIS — F3341 Major depressive disorder, recurrent, in partial remission: Secondary | ICD-10-CM | POA: Diagnosis not present

## 2020-12-12 DIAGNOSIS — Z136 Encounter for screening for cardiovascular disorders: Secondary | ICD-10-CM | POA: Diagnosis not present

## 2021-02-16 DIAGNOSIS — H5213 Myopia, bilateral: Secondary | ICD-10-CM | POA: Diagnosis not present

## 2021-03-27 DIAGNOSIS — R519 Headache, unspecified: Secondary | ICD-10-CM | POA: Diagnosis not present

## 2021-03-27 DIAGNOSIS — M9902 Segmental and somatic dysfunction of thoracic region: Secondary | ICD-10-CM | POA: Diagnosis not present

## 2021-03-27 DIAGNOSIS — M9901 Segmental and somatic dysfunction of cervical region: Secondary | ICD-10-CM | POA: Diagnosis not present

## 2021-03-27 DIAGNOSIS — M5414 Radiculopathy, thoracic region: Secondary | ICD-10-CM | POA: Diagnosis not present

## 2021-03-27 DIAGNOSIS — M5417 Radiculopathy, lumbosacral region: Secondary | ICD-10-CM | POA: Diagnosis not present

## 2021-03-27 DIAGNOSIS — M9903 Segmental and somatic dysfunction of lumbar region: Secondary | ICD-10-CM | POA: Diagnosis not present

## 2021-03-30 DIAGNOSIS — M9903 Segmental and somatic dysfunction of lumbar region: Secondary | ICD-10-CM | POA: Diagnosis not present

## 2021-03-30 DIAGNOSIS — M5417 Radiculopathy, lumbosacral region: Secondary | ICD-10-CM | POA: Diagnosis not present

## 2021-03-30 DIAGNOSIS — R519 Headache, unspecified: Secondary | ICD-10-CM | POA: Diagnosis not present

## 2021-03-30 DIAGNOSIS — M9901 Segmental and somatic dysfunction of cervical region: Secondary | ICD-10-CM | POA: Diagnosis not present

## 2021-03-30 DIAGNOSIS — M9902 Segmental and somatic dysfunction of thoracic region: Secondary | ICD-10-CM | POA: Diagnosis not present

## 2021-03-30 DIAGNOSIS — M5414 Radiculopathy, thoracic region: Secondary | ICD-10-CM | POA: Diagnosis not present

## 2021-04-03 DIAGNOSIS — M9901 Segmental and somatic dysfunction of cervical region: Secondary | ICD-10-CM | POA: Diagnosis not present

## 2021-04-03 DIAGNOSIS — R519 Headache, unspecified: Secondary | ICD-10-CM | POA: Diagnosis not present

## 2021-04-03 DIAGNOSIS — M9902 Segmental and somatic dysfunction of thoracic region: Secondary | ICD-10-CM | POA: Diagnosis not present

## 2021-04-03 DIAGNOSIS — M5414 Radiculopathy, thoracic region: Secondary | ICD-10-CM | POA: Diagnosis not present

## 2021-04-03 DIAGNOSIS — M9903 Segmental and somatic dysfunction of lumbar region: Secondary | ICD-10-CM | POA: Diagnosis not present

## 2021-04-03 DIAGNOSIS — M5417 Radiculopathy, lumbosacral region: Secondary | ICD-10-CM | POA: Diagnosis not present

## 2021-04-06 DIAGNOSIS — M5414 Radiculopathy, thoracic region: Secondary | ICD-10-CM | POA: Diagnosis not present

## 2021-04-06 DIAGNOSIS — M5417 Radiculopathy, lumbosacral region: Secondary | ICD-10-CM | POA: Diagnosis not present

## 2021-04-06 DIAGNOSIS — R519 Headache, unspecified: Secondary | ICD-10-CM | POA: Diagnosis not present

## 2021-04-06 DIAGNOSIS — M9903 Segmental and somatic dysfunction of lumbar region: Secondary | ICD-10-CM | POA: Diagnosis not present

## 2021-04-06 DIAGNOSIS — M9901 Segmental and somatic dysfunction of cervical region: Secondary | ICD-10-CM | POA: Diagnosis not present

## 2021-04-06 DIAGNOSIS — M9902 Segmental and somatic dysfunction of thoracic region: Secondary | ICD-10-CM | POA: Diagnosis not present

## 2021-04-10 DIAGNOSIS — M5414 Radiculopathy, thoracic region: Secondary | ICD-10-CM | POA: Diagnosis not present

## 2021-04-10 DIAGNOSIS — M9901 Segmental and somatic dysfunction of cervical region: Secondary | ICD-10-CM | POA: Diagnosis not present

## 2021-04-10 DIAGNOSIS — M9902 Segmental and somatic dysfunction of thoracic region: Secondary | ICD-10-CM | POA: Diagnosis not present

## 2021-04-10 DIAGNOSIS — M9903 Segmental and somatic dysfunction of lumbar region: Secondary | ICD-10-CM | POA: Diagnosis not present

## 2021-04-10 DIAGNOSIS — M5417 Radiculopathy, lumbosacral region: Secondary | ICD-10-CM | POA: Diagnosis not present

## 2021-04-10 DIAGNOSIS — R519 Headache, unspecified: Secondary | ICD-10-CM | POA: Diagnosis not present

## 2021-04-17 DIAGNOSIS — F322 Major depressive disorder, single episode, severe without psychotic features: Secondary | ICD-10-CM | POA: Diagnosis not present

## 2021-04-17 DIAGNOSIS — F4312 Post-traumatic stress disorder, chronic: Secondary | ICD-10-CM | POA: Diagnosis not present

## 2021-04-17 DIAGNOSIS — F411 Generalized anxiety disorder: Secondary | ICD-10-CM | POA: Diagnosis not present

## 2021-04-23 DIAGNOSIS — F322 Major depressive disorder, single episode, severe without psychotic features: Secondary | ICD-10-CM | POA: Diagnosis not present

## 2021-04-23 DIAGNOSIS — F4312 Post-traumatic stress disorder, chronic: Secondary | ICD-10-CM | POA: Diagnosis not present

## 2021-04-23 DIAGNOSIS — F411 Generalized anxiety disorder: Secondary | ICD-10-CM | POA: Diagnosis not present

## 2021-04-24 DIAGNOSIS — M9903 Segmental and somatic dysfunction of lumbar region: Secondary | ICD-10-CM | POA: Diagnosis not present

## 2021-04-24 DIAGNOSIS — M5417 Radiculopathy, lumbosacral region: Secondary | ICD-10-CM | POA: Diagnosis not present

## 2021-04-24 DIAGNOSIS — R519 Headache, unspecified: Secondary | ICD-10-CM | POA: Diagnosis not present

## 2021-04-24 DIAGNOSIS — M5414 Radiculopathy, thoracic region: Secondary | ICD-10-CM | POA: Diagnosis not present

## 2021-04-24 DIAGNOSIS — M9902 Segmental and somatic dysfunction of thoracic region: Secondary | ICD-10-CM | POA: Diagnosis not present

## 2021-04-24 DIAGNOSIS — M9901 Segmental and somatic dysfunction of cervical region: Secondary | ICD-10-CM | POA: Diagnosis not present

## 2021-04-27 DIAGNOSIS — M5417 Radiculopathy, lumbosacral region: Secondary | ICD-10-CM | POA: Diagnosis not present

## 2021-04-27 DIAGNOSIS — M5414 Radiculopathy, thoracic region: Secondary | ICD-10-CM | POA: Diagnosis not present

## 2021-04-27 DIAGNOSIS — M9902 Segmental and somatic dysfunction of thoracic region: Secondary | ICD-10-CM | POA: Diagnosis not present

## 2021-04-27 DIAGNOSIS — R519 Headache, unspecified: Secondary | ICD-10-CM | POA: Diagnosis not present

## 2021-04-27 DIAGNOSIS — M9901 Segmental and somatic dysfunction of cervical region: Secondary | ICD-10-CM | POA: Diagnosis not present

## 2021-04-27 DIAGNOSIS — M9903 Segmental and somatic dysfunction of lumbar region: Secondary | ICD-10-CM | POA: Diagnosis not present

## 2021-04-30 DIAGNOSIS — M5417 Radiculopathy, lumbosacral region: Secondary | ICD-10-CM | POA: Diagnosis not present

## 2021-04-30 DIAGNOSIS — F4312 Post-traumatic stress disorder, chronic: Secondary | ICD-10-CM | POA: Diagnosis not present

## 2021-04-30 DIAGNOSIS — M5414 Radiculopathy, thoracic region: Secondary | ICD-10-CM | POA: Diagnosis not present

## 2021-04-30 DIAGNOSIS — M9903 Segmental and somatic dysfunction of lumbar region: Secondary | ICD-10-CM | POA: Diagnosis not present

## 2021-04-30 DIAGNOSIS — F322 Major depressive disorder, single episode, severe without psychotic features: Secondary | ICD-10-CM | POA: Diagnosis not present

## 2021-04-30 DIAGNOSIS — F411 Generalized anxiety disorder: Secondary | ICD-10-CM | POA: Diagnosis not present

## 2021-04-30 DIAGNOSIS — R519 Headache, unspecified: Secondary | ICD-10-CM | POA: Diagnosis not present

## 2021-04-30 DIAGNOSIS — M9901 Segmental and somatic dysfunction of cervical region: Secondary | ICD-10-CM | POA: Diagnosis not present

## 2021-04-30 DIAGNOSIS — M9902 Segmental and somatic dysfunction of thoracic region: Secondary | ICD-10-CM | POA: Diagnosis not present

## 2021-05-07 DIAGNOSIS — R519 Headache, unspecified: Secondary | ICD-10-CM | POA: Diagnosis not present

## 2021-05-07 DIAGNOSIS — M5414 Radiculopathy, thoracic region: Secondary | ICD-10-CM | POA: Diagnosis not present

## 2021-05-07 DIAGNOSIS — F4312 Post-traumatic stress disorder, chronic: Secondary | ICD-10-CM | POA: Diagnosis not present

## 2021-05-07 DIAGNOSIS — M9902 Segmental and somatic dysfunction of thoracic region: Secondary | ICD-10-CM | POA: Diagnosis not present

## 2021-05-07 DIAGNOSIS — M5417 Radiculopathy, lumbosacral region: Secondary | ICD-10-CM | POA: Diagnosis not present

## 2021-05-07 DIAGNOSIS — M9903 Segmental and somatic dysfunction of lumbar region: Secondary | ICD-10-CM | POA: Diagnosis not present

## 2021-05-07 DIAGNOSIS — M9901 Segmental and somatic dysfunction of cervical region: Secondary | ICD-10-CM | POA: Diagnosis not present

## 2021-05-07 DIAGNOSIS — F322 Major depressive disorder, single episode, severe without psychotic features: Secondary | ICD-10-CM | POA: Diagnosis not present

## 2021-05-07 DIAGNOSIS — F411 Generalized anxiety disorder: Secondary | ICD-10-CM | POA: Diagnosis not present

## 2021-05-15 DIAGNOSIS — F322 Major depressive disorder, single episode, severe without psychotic features: Secondary | ICD-10-CM | POA: Diagnosis not present

## 2021-05-15 DIAGNOSIS — F411 Generalized anxiety disorder: Secondary | ICD-10-CM | POA: Diagnosis not present

## 2021-05-15 DIAGNOSIS — F4312 Post-traumatic stress disorder, chronic: Secondary | ICD-10-CM | POA: Diagnosis not present

## 2021-05-21 DIAGNOSIS — F411 Generalized anxiety disorder: Secondary | ICD-10-CM | POA: Diagnosis not present

## 2021-05-21 DIAGNOSIS — F322 Major depressive disorder, single episode, severe without psychotic features: Secondary | ICD-10-CM | POA: Diagnosis not present

## 2021-05-21 DIAGNOSIS — F4312 Post-traumatic stress disorder, chronic: Secondary | ICD-10-CM | POA: Diagnosis not present

## 2021-05-22 DIAGNOSIS — M62830 Muscle spasm of back: Secondary | ICD-10-CM | POA: Diagnosis not present

## 2021-05-22 DIAGNOSIS — R300 Dysuria: Secondary | ICD-10-CM | POA: Diagnosis not present

## 2021-06-04 DIAGNOSIS — F4312 Post-traumatic stress disorder, chronic: Secondary | ICD-10-CM | POA: Diagnosis not present

## 2021-06-04 DIAGNOSIS — F411 Generalized anxiety disorder: Secondary | ICD-10-CM | POA: Diagnosis not present

## 2021-06-04 DIAGNOSIS — F322 Major depressive disorder, single episode, severe without psychotic features: Secondary | ICD-10-CM | POA: Diagnosis not present

## 2021-06-11 DIAGNOSIS — F322 Major depressive disorder, single episode, severe without psychotic features: Secondary | ICD-10-CM | POA: Diagnosis not present

## 2021-06-11 DIAGNOSIS — F411 Generalized anxiety disorder: Secondary | ICD-10-CM | POA: Diagnosis not present

## 2021-06-11 DIAGNOSIS — F4312 Post-traumatic stress disorder, chronic: Secondary | ICD-10-CM | POA: Diagnosis not present

## 2021-06-14 DIAGNOSIS — F4312 Post-traumatic stress disorder, chronic: Secondary | ICD-10-CM | POA: Diagnosis not present

## 2021-06-14 DIAGNOSIS — F411 Generalized anxiety disorder: Secondary | ICD-10-CM | POA: Diagnosis not present

## 2021-06-14 DIAGNOSIS — F322 Major depressive disorder, single episode, severe without psychotic features: Secondary | ICD-10-CM | POA: Diagnosis not present

## 2021-06-18 DIAGNOSIS — F4312 Post-traumatic stress disorder, chronic: Secondary | ICD-10-CM | POA: Diagnosis not present

## 2021-06-18 DIAGNOSIS — F322 Major depressive disorder, single episode, severe without psychotic features: Secondary | ICD-10-CM | POA: Diagnosis not present

## 2021-06-18 DIAGNOSIS — F411 Generalized anxiety disorder: Secondary | ICD-10-CM | POA: Diagnosis not present

## 2021-07-24 DIAGNOSIS — F4312 Post-traumatic stress disorder, chronic: Secondary | ICD-10-CM | POA: Diagnosis not present

## 2021-07-24 DIAGNOSIS — F322 Major depressive disorder, single episode, severe without psychotic features: Secondary | ICD-10-CM | POA: Diagnosis not present

## 2021-07-24 DIAGNOSIS — F411 Generalized anxiety disorder: Secondary | ICD-10-CM | POA: Diagnosis not present

## 2021-09-06 DIAGNOSIS — L237 Allergic contact dermatitis due to plants, except food: Secondary | ICD-10-CM | POA: Diagnosis not present

## 2021-09-27 ENCOUNTER — Encounter: Payer: Self-pay | Admitting: Gastroenterology

## 2021-11-14 ENCOUNTER — Encounter: Payer: Self-pay | Admitting: Gastroenterology

## 2021-11-14 ENCOUNTER — Other Ambulatory Visit: Payer: Self-pay

## 2021-11-14 ENCOUNTER — Ambulatory Visit (AMBULATORY_SURGERY_CENTER): Payer: PPO | Admitting: *Deleted

## 2021-11-14 VITALS — Ht 68.0 in | Wt 145.0 lb

## 2021-11-14 DIAGNOSIS — Z1211 Encounter for screening for malignant neoplasm of colon: Secondary | ICD-10-CM

## 2021-11-14 MED ORDER — SUTAB 1479-225-188 MG PO TABS: 1.0000 | ORAL_TABLET | Freq: Once | ORAL | 0 refills | Status: AC

## 2021-11-14 NOTE — Progress Notes (Signed)
Virtual pre visit completed in person.    No egg or soy allergy known to patient  No issues known to pt with past sedation with any surgeries or procedures Patient denies ever being told they had issues or difficulty with intubation  No FH of Malignant Hyperthermia Pt is not on diet pills Pt is not on  home 02  Pt is not on blood thinners  Pt denies issues with constipation  No A fib or A flutter  Pt is fully vaccinated  for Covid   Coupon given to pt in PV today , Code to Pharmacy and  NO PA's for preps discussed with pt In PV today  Discussed with pt there will be an out-of-pocket cost for prep and that varies from $0 to 70 +  dollars - pt verbalized understanding   Due to the COVID-19 pandemic we are asking patients to follow certain guidelines in PV and the [place]   Pt aware of COVID protocols and [facility] guidelines

## 2021-11-29 ENCOUNTER — Ambulatory Visit (AMBULATORY_SURGERY_CENTER): Payer: Medicare HMO | Admitting: Gastroenterology

## 2021-11-29 ENCOUNTER — Encounter: Payer: Self-pay | Admitting: Gastroenterology

## 2021-11-29 ENCOUNTER — Other Ambulatory Visit: Payer: Self-pay

## 2021-11-29 VITALS — BP 110/58 | HR 73 | Temp 97.0°F | Resp 18 | Ht 68.0 in | Wt 145.0 lb

## 2021-11-29 DIAGNOSIS — K621 Rectal polyp: Secondary | ICD-10-CM

## 2021-11-29 DIAGNOSIS — K635 Polyp of colon: Secondary | ICD-10-CM

## 2021-11-29 DIAGNOSIS — Z1211 Encounter for screening for malignant neoplasm of colon: Secondary | ICD-10-CM | POA: Diagnosis not present

## 2021-11-29 DIAGNOSIS — D128 Benign neoplasm of rectum: Secondary | ICD-10-CM

## 2021-11-29 DIAGNOSIS — D127 Benign neoplasm of rectosigmoid junction: Secondary | ICD-10-CM | POA: Diagnosis not present

## 2021-11-29 MED ORDER — SODIUM CHLORIDE 0.9 % IV SOLN: 500.0000 mL | Freq: Once | INTRAVENOUS | Status: DC

## 2021-11-29 NOTE — Progress Notes (Signed)
Called to room to assist during endoscopic procedure.  Patient ID and intended procedure confirmed with present staff. Received instructions for my participation in the procedure from the performing physician.  

## 2021-11-29 NOTE — Op Note (Signed)
[facility] Patient Name: [name] Procedure Date: 11/29/2021 8:38 AM MRN: [id number] Endoscopist: Remo Lipps P. Havery Moros , MD Age: 50 Referring MD:  Date of Birth: 1971-01-06 Gender: Female Account #: [id number] Procedure:                Colonoscopy Indications:              Screening for colorectal malignant neoplasm, This                            is the patient's first colonoscopy Medicines:                Monitored Anesthesia Care Procedure:                Pre-Anesthesia Assessment:                           - Prior to the procedure, a History and Physical                            was performed, and patient medications and                            allergies were reviewed. The patient's tolerance of                            previous anesthesia was also reviewed. The risks                            and benefits of the procedure and the sedation                            options and risks were discussed with the patient.                            All questions were answered, and informed consent                            was obtained. Prior Anticoagulants: The patient has                            taken no previous anticoagulant or antiplatelet                            agents. ASA Grade Assessment: II - A patient with                            mild systemic disease. After reviewing the risks                            and benefits, the patient was deemed in                            satisfactory condition to undergo the procedure.  After obtaining informed consent, the colonoscope                            was passed under direct vision. Throughout the                            procedure, the patient's blood pressure, pulse, and                            oxygen saturations were monitored continuously. The                            Olympus PCF-H190DL (#[id number]) Colonoscope was                            introduced through the  anus and advanced to the the                            cecum, identified by appendiceal orifice and                            ileocecal valve. The colonoscopy was performed                            without difficulty. The patient tolerated the                            procedure well. The quality of the bowel                            preparation was adequate. The ileocecal valve,                            appendiceal orifice, and rectum were photographed. Scope In: 8:59:30 AM Scope Out: 9:22:03 AM Scope Withdrawal Time: 0 hours 17 minutes 43 seconds  Total Procedure Duration: 0 hours 22 minutes 33 seconds  Findings:                 Hemorrhoids were found on perianal exam.                           A 3 mm polyp was found in the rectum. The polyp was                            flat. The polyp was removed with a cold snare.                            Resection and retrieval were complete.                           A 4 mm polyp was found in the recto-sigmoid colon.                            The polyp was sessile. The  polyp was removed with a                            cold snare. Resection and retrieval were complete.                            This was taken off during cecal intubation and                            photographed at that time but not stored.                           Diffuse mild melanosis was found in the entire                            colon.                           Internal hemorrhoids were found during retroflexion.                           The exam was otherwise without abnormality. Of                            note, due to software issue, photos were not                            recorded for the first half of the exam (were taken                            but not stored for some reason), only photos of                            left colon were recorded. Complications:            No immediate complications. Estimated blood loss:                             Minimal. Estimated Blood Loss:     Estimated blood loss was minimal. Impression:               - Hemorrhoids found on perianal exam.                           - One 3 mm polyp in the rectum, removed with a cold                            snare. Resected and retrieved.                           - One 4 mm polyp at the recto-sigmoid colon,                            removed with a cold snare. Resected and retrieved.                           -  Melanosis in the colon.                           - Internal hemorrhoids.                           - The examination was otherwise normal. Recommendation:           - Patient has a contact number available for                            emergencies. The signs and symptoms of potential                            delayed complications were discussed with the                            patient. Return to normal activities tomorrow.                            Written discharge instructions were provided to the                            patient.                           - Resume previous diet.                           - Continue present medications.                           - Await pathology results. Remo Lipps P. Avaeh Ewer, MD 11/29/2021 9:26:40 AM This report has been signed electronically.

## 2021-11-29 NOTE — Progress Notes (Signed)
Pt stable to RR 
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
## 2021-11-29 NOTE — Progress Notes (Signed)
VS- [facility]  Cell phone off per pt  Pt's states no medical or surgical changes since previsit or office visit.

## 2021-11-29 NOTE — Patient Instructions (Signed)
YOU HAD AN ENDOSCOPIC PROCEDURE TODAY AT [facility]:   Refer to the procedure report that was given to you for any specific questions about what was found during the examination.  If the procedure report does not answer your questions, please call your gastroenterologist to clarify.  If you requested that your care partner not be given the details of your procedure findings, then the procedure report has been included in a sealed envelope for you to review at your convenience later.  YOU SHOULD EXPECT: Some feelings of bloating in the abdomen. Passage of more gas than usual.  Walking can help get rid of the air that was put into your GI tract during the procedure and reduce the bloating. If you had a lower endoscopy (such as a colonoscopy or flexible sigmoidoscopy) you may notice spotting of blood in your stool or on the toilet paper. If you underwent a bowel prep for your procedure, you may not have a normal bowel movement for a few days.  Please Note:  You might notice some irritation and congestion in your nose or some drainage.  This is from the oxygen used during your procedure.  There is no need for concern and it should clear up in a day or so.  SYMPTOMS TO REPORT IMMEDIATELY:  Following lower endoscopy (colonoscopy or flexible sigmoidoscopy):  Excessive amounts of blood in the stool  Significant tenderness or worsening of abdominal pains  Swelling of the abdomen that is new, acute  Fever of 100F or higher   For urgent or emergent issues, a gastroenterologist can be reached at any hour by calling [phone]. Do not use MyChart messaging for urgent concerns.    DIET:  We do recommend a small meal at first, but then you may proceed to your regular diet.  Drink plenty of fluids but you should avoid alcoholic beverages for 24 hours.  ACTIVITY:  You should plan to take it easy for the rest of today and you should NOT DRIVE or use heavy machinery until tomorrow (because  of the sedation medicines used during the test).    FOLLOW UP: Our staff will call the number listed on your records 48-72 hours following your procedure to check on you and address any questions or concerns that you may have regarding the information given to you following your procedure. If we do not reach you, we will leave a message.  We will attempt to reach you two times.  During this call, we will ask if you have developed any symptoms of COVID 19. If you develop any symptoms (ie: fever, flu-like symptoms, shortness of breath, cough etc.) before then, please call [phone].  If you test positive for Covid 19 in the 2 weeks post procedure, please call and report this information to [place].    If any biopsies were taken you will be contacted by phone or by letter within the next 1-3 weeks.  Please call us at [phone] if you have not heard about the biopsies in 3 weeks.    SIGNATURES/CONFIDENTIALITY: You and/or your care partner have signed paperwork which will be entered into your electronic medical record.  These signatures attest to the fact that that the information above on your After Visit Summary has been reviewed and is understood.  Full responsibility of the confidentiality of this discharge information lies with you and/or your care-partner.    Resume medications. Information given on polyps and hemorrhoid banding.

## 2021-12-03 ENCOUNTER — Telehealth: Payer: Self-pay

## 2021-12-03 NOTE — Telephone Encounter (Signed)
  Follow up Call-  Call back number 11/29/2021  Post procedure Call Back phone  # [phone]  Permission to leave phone message Yes  Some recent data might be hidden     Patient questions:  Do you have a fever, pain , or abdominal swelling? No. Pain Score  0 *  Have you tolerated food without any problems? Yes.    Have you been able to return to your normal activities? Yes.    Do you have any questions about your discharge instructions: Diet   No. Medications  No. Follow up visit  No.  Do you have questions or concerns about your Care? Yes.    Pt states someone was supposed to call her to sched the hemorrhoid banding procedure, let her know I would check w/ the office and she requested someone call her to sched. Thanks, J Actions: * If pain score is 4 or above: No action needed, pain <4.  Have you developed a fever since your procedure? no  2.   Have you had an respiratory symptoms (SOB or cough) since your procedure? no  3.   Have you tested positive for COVID 19 since your procedure no  4.   Have you had any family members/close contacts diagnosed with the COVID 19 since your procedure?  no   If yes to any of these questions please route to Joylene John, RN and Joella Prince, RN

## 2021-12-03 NOTE — Telephone Encounter (Signed)
Lm on vm for patient to return call to get scheduled for a hemorrhoid banding with Dr. Havery Moros.

## 2021-12-04 ENCOUNTER — Telehealth: Payer: Self-pay

## 2021-12-04 NOTE — Telephone Encounter (Signed)
-----   Message from Roetta Sessions, [place] sent at 11/29/2021  6:10 PM EST ----- Regarding: FW: banding  ----- Message ----- From: Yetta Flock, MD Sent: 11/29/2021  12:46 PM EST To: Roetta Sessions, CMA Subject: banding                                        Jan can you help schedule this patient for a hemorrhoid banding? Thanks

## 2021-12-04 NOTE — Telephone Encounter (Signed)
[name], please see note below. Patient just needs to be scheduled for a hemorrhoid banding with Dr. Havery Moros. Thanks

## 2021-12-04 NOTE — Telephone Encounter (Signed)
Called and spoke to patient.  Scheduled her for banding appts with Dr. Havery Moros

## 2021-12-04 NOTE — Telephone Encounter (Signed)
Patient returned your phone call.  Please call her back at [phone].  Thank you.

## 2021-12-12 DIAGNOSIS — Z03818 Encounter for observation for suspected exposure to other biological agents ruled out: Secondary | ICD-10-CM | POA: Diagnosis not present

## 2021-12-12 DIAGNOSIS — R051 Acute cough: Secondary | ICD-10-CM | POA: Diagnosis not present

## 2021-12-12 DIAGNOSIS — Z20828 Contact with and (suspected) exposure to other viral communicable diseases: Secondary | ICD-10-CM | POA: Diagnosis not present

## 2021-12-12 DIAGNOSIS — J029 Acute pharyngitis, unspecified: Secondary | ICD-10-CM | POA: Diagnosis not present

## 2021-12-17 DIAGNOSIS — Z1389 Encounter for screening for other disorder: Secondary | ICD-10-CM | POA: Diagnosis not present

## 2021-12-17 DIAGNOSIS — Z136 Encounter for screening for cardiovascular disorders: Secondary | ICD-10-CM | POA: Diagnosis not present

## 2021-12-17 DIAGNOSIS — F3341 Major depressive disorder, recurrent, in partial remission: Secondary | ICD-10-CM | POA: Diagnosis not present

## 2021-12-17 DIAGNOSIS — E559 Vitamin D deficiency, unspecified: Secondary | ICD-10-CM | POA: Diagnosis not present

## 2021-12-17 DIAGNOSIS — Z13220 Encounter for screening for lipoid disorders: Secondary | ICD-10-CM | POA: Diagnosis not present

## 2021-12-17 DIAGNOSIS — E0590 Thyrotoxicosis, unspecified without thyrotoxic crisis or storm: Secondary | ICD-10-CM | POA: Diagnosis not present

## 2021-12-17 DIAGNOSIS — J45909 Unspecified asthma, uncomplicated: Secondary | ICD-10-CM | POA: Diagnosis not present

## 2021-12-17 DIAGNOSIS — Z0000 Encounter for general adult medical examination without abnormal findings: Secondary | ICD-10-CM | POA: Diagnosis not present

## 2021-12-17 DIAGNOSIS — Z1231 Encounter for screening mammogram for malignant neoplasm of breast: Secondary | ICD-10-CM | POA: Diagnosis not present

## 2022-01-02 ENCOUNTER — Other Ambulatory Visit: Payer: Self-pay | Admitting: Physician Assistant

## 2022-01-02 DIAGNOSIS — Z1231 Encounter for screening mammogram for malignant neoplasm of breast: Secondary | ICD-10-CM

## 2022-01-17 ENCOUNTER — Ambulatory Visit: Payer: Medicare HMO | Admitting: Gastroenterology

## 2022-01-17 ENCOUNTER — Encounter: Payer: Self-pay | Admitting: Gastroenterology

## 2022-01-17 VITALS — BP 96/68 | Ht 68.0 in | Wt 150.8 lb

## 2022-01-17 DIAGNOSIS — K642 Third degree hemorrhoids: Secondary | ICD-10-CM | POA: Diagnosis not present

## 2022-01-17 MED ORDER — LINACLOTIDE 72 MCG PO CAPS: 72.0000 ug | ORAL_CAPSULE | Freq: Every day | ORAL | 0 refills | Status: DC

## 2022-01-17 MED ORDER — LINACLOTIDE 145 MCG PO CAPS: 145.0000 ug | ORAL_CAPSULE | Freq: Every day | ORAL | 0 refills | Status: DC

## 2022-01-17 NOTE — Patient Instructions (Signed)
If you are age 53 or older, your body mass index should be between 23-30. Your Body mass index is 22.93 kg/m. If this is out of the aforementioned range listed, please consider follow up with your Primary Care Provider.  If you are age 78 or younger, your body mass index should be between 19-25. Your Body mass index is 22.93 kg/m. If this is out of the aformentioned range listed, please consider follow up with your Primary Care Provider.   ________________________________________________________  The [facility] providers would like to encourage you to use [facility] to communicate with providers for non-urgent requests or questions.  Due to long hold times on the telephone, sending your provider a message by [facility] may be a faster and more efficient way to get a response.  Please allow 48 business hours for a response.  Please remember that this is for non-urgent requests.  _______________________________________________________  We have given you samples of the following medication to take: Linzess 72 mcg (and Linzess 145 mcg): Take one tablet before breakfast.  Try the lower dose first for about a week and if that is not effective you can try the 145 mcg  HEMORRHOID BANDING PROCEDURE    FOLLOW-UP CARE   The procedure you have had should have been relatively painless since the banding of the area involved does not have nerve endings and there is no pain sensation.  The rubber band cuts off the blood supply to the hemorrhoid and the band may fall off as soon as 48 hours after the banding (the band may occasionally be seen in the toilet bowl following a bowel movement). You may notice a temporary feeling of fullness in the rectum which should respond adequately to plain Tylenol or Motrin.  Following the banding, avoid strenuous exercise that evening and resume full activity the next day.  A sitz bath (soaking in a warm tub) or bidet is soothing, and can be useful for cleansing the area after  bowel movements.     To avoid constipation, take two tablespoons of natural wheat bran, natural oat bran, flax, Benefiber or any over the counter fiber supplement and increase your water intake to 7-8 glasses daily.    Unless you have been prescribed anorectal medication, do not put anything inside your rectum for two weeks: No suppositories, enemas, fingers, etc.  Occasionally, you may have more bleeding than usual after the banding procedure.  This is often from the untreated hemorrhoids rather than the treated one.  Dont be concerned if there is a tablespoon or so of blood.  If there is more blood than this, lie flat with your bottom higher than your head and apply an ice pack to the area. If the bleeding does not stop within a half an hour or if you feel faint, call our office at [phone] or go to the emergency room.  Problems are not common; however, if there is a substantial amount of bleeding, severe pain, chills, fever or difficulty passing urine (very rare) or other problems, you should call us at [phone] or report to the nearest emergency room.  Do not stay seated continuously for more than 2-3 hours for a day or two after the procedure.  Tighten your buttock muscles 10-15 times every two hours and take 10-15 deep breaths every 1-2 hours.  Do not spend more than a few minutes on the toilet if you cannot empty your bowel; instead re-visit the toilet at a later time.   Your  next appointment is scheduled for Tuesday, 2-21 at 3:40 pm.    Thank you for entrusting me with your care and for choosing [facility], Dr. Lyons Cellar

## 2022-01-17 NOTE — Progress Notes (Signed)
51 year old female known to me from prior colonoscopy in December, here for hemorrhoid banding procedure.  She had hemorrhoids noted on her colonoscopy.  She states she has had symptoms of hemorrhoids for some time now.  She endorses grade 3 prolapse with occasional bleeding, often has swelling and discomfort with pruritus.  Seems to be worsening over time.  She has some baseline constipation that she treated with fiber supplements which did not help too much.  When she takes MiraLAX she states it causes swelling in her legs so she can take that.  She is having a bowel movement every other day using other over-the-counter laxatives, she inquires about other options  We discussed options for treatment of hemorrhoids and she wanted to proceed with hemorrhoid banding after discussion.   Colonoscopy 11/29/2021 - Hemorrhoids were found on perianal exam. - A 3 mm polyp was found in the rectum. The polyp was flat. The polyp was removed with a cold snare. Resection and retrieval were complete. - A 4 mm polyp was found in the recto-sigmoid colon. The polyp was sessile. The polyp was removed with a cold snare. Resection and retrieval were complete. This was taken off during cecal intubation and photographed at that time but not stored. - Diffuse mild melanosis was found in the entire colon. - Internal hemorrhoids were found during retroflexion. - The exam was otherwise without abnormality. Of note, due to software issue, photos were not recorded for the first half of the exam (were taken but not stored for some reason), only photos of left colon were recorded.   PROCEDURE NOTE: The patient presents with symptomatic grade III  hemorrhoids, requesting rubber band ligation of his/her hemorrhoidal disease.  All risks, benefits and alternative forms of therapy were described and informed consent was obtained.   The anorectum was pre-medicated with 0.125% nitroglycerin The decision was made to band the LL  internal hemorrhoid, and the [place] was used to perform band ligation without complication.  Digital anorectal examination was then performed to assure proper positioning of the band, and to adjust the banded tissue as required.  The patient was discharged home without pain or other issues.  Dietary and behavioral recommendations were given and along with follow-up instructions.     The following adjunctive treatments were recommended: Provided Linzess samples - 128mcg / day or 72.5mg  / day - take daily to keep stools soft and prevent straining.  The patient will return in 2-4 weeks  for  follow-up and possible additional banding as required. No complications were encountered and the patient tolerated the procedure well.   Jolly Mango, MD [facility]

## 2022-01-30 ENCOUNTER — Encounter: Payer: Self-pay | Admitting: Gastroenterology

## 2022-01-30 ENCOUNTER — Other Ambulatory Visit: Payer: Self-pay

## 2022-01-30 ENCOUNTER — Other Ambulatory Visit: Payer: Self-pay | Admitting: Gastroenterology

## 2022-01-30 MED ORDER — LINACLOTIDE 145 MCG PO CAPS
145.0000 ug | ORAL_CAPSULE | Freq: Every day | ORAL | 2 refills | Status: DC
  Filled 2022-01-30: qty 30, 30d supply, fill #0

## 2022-01-31 ENCOUNTER — Other Ambulatory Visit (HOSPITAL_COMMUNITY): Payer: Self-pay

## 2022-01-31 ENCOUNTER — Other Ambulatory Visit: Payer: Self-pay

## 2022-01-31 MED ORDER — LINACLOTIDE 145 MCG PO CAPS: 145.0000 ug | ORAL_CAPSULE | Freq: Every day | ORAL | 3 refills | Status: DC

## 2022-01-31 NOTE — Progress Notes (Signed)
Refill of Linzess 145 mcg sent to pharmacy per request

## 2022-02-01 ENCOUNTER — Other Ambulatory Visit (HOSPITAL_COMMUNITY): Payer: Self-pay

## 2022-02-16 DIAGNOSIS — J019 Acute sinusitis, unspecified: Secondary | ICD-10-CM | POA: Diagnosis not present

## 2022-02-19 ENCOUNTER — Telehealth: Payer: Self-pay | Admitting: Gastroenterology

## 2022-02-19 ENCOUNTER — Encounter: Payer: Self-pay | Admitting: Gastroenterology

## 2022-02-19 ENCOUNTER — Ambulatory Visit: Payer: Medicare HMO | Admitting: Gastroenterology

## 2022-02-19 VITALS — BP 100/54 | HR 84 | Ht 68.0 in | Wt 149.6 lb

## 2022-02-19 DIAGNOSIS — K602 Anal fissure, unspecified: Secondary | ICD-10-CM

## 2022-02-19 DIAGNOSIS — K642 Third degree hemorrhoids: Secondary | ICD-10-CM | POA: Diagnosis not present

## 2022-02-19 DIAGNOSIS — K5900 Constipation, unspecified: Secondary | ICD-10-CM | POA: Diagnosis not present

## 2022-02-19 MED ORDER — CALMOSEPTINE 0.44-20.6 % EX OINT: TOPICAL_OINTMENT | CUTANEOUS | 0 refills | Status: DC

## 2022-02-19 MED ORDER — AMBULATORY NON FORMULARY MEDICATION: 0 refills | Status: DC

## 2022-02-19 NOTE — Patient Instructions (Signed)
If you are age 53 or older, your body mass index should be between 23-30. Your Body mass index is 22.75 kg/m. If this is out of the aforementioned range listed, please consider follow up with your Primary Care Provider.  If you are age 53 or younger, your body mass index should be between 19-25. Your Body mass index is 22.75 kg/m. If this is out of the aformentioned range listed, please consider follow up with your Primary Care Provider.   ________________________________________________________  The [facility] providers would like to encourage you to use [facility] to communicate with providers for non-urgent requests or questions.  Due to long hold times on the telephone, sending your provider a message by [facility] may be a faster and more efficient way to get a response.  Please allow 48 business hours for a response.  Please remember that this is for non-urgent requests.  _______________________________________________________  Dennis Bast have a banding appointment scheduled for Thursday, 3-16 at 3:40pm.  Continue Linzess.  We are giving you a sample of Calmoseptine to use rectally.  We have sent a prescription for nitroglycerin 0.125% gel to [facility]. You should apply a pea size amount to your rectum three times daily for several weeks.  [facility]'s information is below: Address: [address] [place], [place] [address]  [phone]  *Please DO NOT go directly from our office to pick up this medication! Give the pharmacy 1 day to process the prescription as this is compounded and takes time to make.   Thank you for entrusting me with your care and for choosing [facility], Dr. Eaton Cellar

## 2022-02-19 NOTE — Telephone Encounter (Signed)
Script faxed to [facility]. Receipt confirmed
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
## 2022-02-19 NOTE — Telephone Encounter (Signed)
Inbound call from pt stating that the pharmacy had advised her that they never received her prescription for Nitroglycerine ointment. She will need it resent to [facility] and phone number is [phone]. Thank you.

## 2022-03-14 ENCOUNTER — Encounter: Payer: Self-pay | Admitting: Gastroenterology

## 2022-03-14 ENCOUNTER — Ambulatory Visit: Payer: Medicare HMO | Admitting: Gastroenterology

## 2022-03-14 VITALS — BP 116/78 | HR 55 | Ht 68.0 in | Wt 149.2 lb

## 2022-03-14 DIAGNOSIS — K642 Third degree hemorrhoids: Secondary | ICD-10-CM

## 2022-03-14 NOTE — Patient Instructions (Addendum)
If you are age 53 or older, your body mass index should be between 23-30. Your Body mass index is 22.69 kg/m?Marland Kitchen If this is out of the aforementioned range listed, please consider follow up with your Primary Care Provider. ? ?If you are age 59 or younger, your body mass index should be between 19-25. Your Body mass index is 22.69 kg/m?Marland Kitchen If this is out of the aformentioned range listed, please consider follow up with your Primary Care Provider.  ? ?________________________________________________________ ? ?The [facility] providers would like to encourage you to use [facility] to communicate with providers for non-urgent requests or questions.  Due to long hold times on the telephone, sending your provider a message by [facility] may be a faster and more efficient way to get a response.  Please allow 48 business hours for a response.  Please remember that this is for non-urgent requests.  ?_______________________________________________________ ? ?HEMORRHOID BANDING PROCEDURE  ? ? FOLLOW-UP CARE ? ? ?The procedure you have had should have been relatively painless since the banding of the area involved does not have nerve endings and there is no pain sensation.  The rubber band cuts off the blood supply to the hemorrhoid and the band may fall off as soon as 48 hours after the banding (the band may occasionally be seen in the toilet bowl following a bowel movement). You may notice a temporary feeling of fullness in the rectum which should respond adequately to plain Tylenol? or Motrin?. ? ?Following the banding, avoid strenuous exercise that evening and resume full activity the next day.  A sitz bath (soaking in a warm tub) or bidet is soothing, and can be useful for cleansing the area after bowel movements.   ? ? ?To avoid constipation, take two tablespoons of natural wheat bran, natural oat bran, flax, Benefiber? or any over the counter fiber supplement and increase your water intake to 7-8 glasses daily.   ? ?Unless you  have been prescribed anorectal medication, do not put anything inside your rectum for two weeks: No suppositories, enemas, fingers, etc. ? ?Occasionally, you may have more bleeding than usual after the banding procedure.  This is often from the untreated hemorrhoids rather than the treated one.  Don?t be concerned if there is a tablespoon or so of blood.  If there is more blood than this, lie flat with your bottom higher than your head and apply an ice pack to the area. If the bleeding does not stop within a half an hour or if you feel faint, call our office at [phone] or go to the emergency room. ? ?Problems are not common; however, if there is a substantial amount of bleeding, severe pain, chills, fever or difficulty passing urine (very rare) or other problems, you should call us at [phone] or report to the nearest emergency room. ? ?Do not stay seated continuously for more than 2-3 hours for a day or two after the procedure.  Tighten your buttock muscles 10-15 times every two hours and take 10-15 deep breaths every 1-2 hours.  Do not spend more than a few minutes on the toilet if you cannot empty your bowel; instead re-visit the toilet at a later time. ? ? ?We have scheduled you for a 3rd and final banding appointment on Thursday, 04-18-22 at 3:40 pm. If you need to reschedule this appointment please as soon as possible:  [phone]. ? ?Thank you for entrusting me with your care and for choosing [facility], ?Dr. Remo Lipps  [place] ? ? ?

## 2022-03-14 NOTE — Progress Notes (Signed)
51 year old female here for follow-up visit to discuss hemorrhoid banding.  Recall she has had symptomatic hemorrhoids for a long time now, grade 3 prolapse with bleeding, swelling, pruritus.  She has had baseline constipation in the setting of this.  Is intolerant to MiraLAX.  She had been given a trial of Linzess 145 mcg/day. At our last visit in February she had developed an anal fissure. Treated with topical nitroglycerin and calmoseptine ointment OTC which helped quite a bit. At one point she was not sure if Linzess cause a rash / irritation of her perianal area and she stopped it for a bit.  ? ?She wishes to proceed with additional banding and continue the treatment at this time. ? ?PROCEDURE NOTE: ?The patient presents with symptomatic grade III  hemorrhoids, requesting rubber band ligation of his/her hemorrhoidal disease.  All risks, benefits and alternative forms of therapy were described and informed consent was obtained. ? ? ?The anorectum was pre-medicated with 0.125% nitroglycerin ?The decision was made to band the RP internal hemorrhoid, and the [place] O?Regan System was used to perform band ligation without complication. Attached at junction between RP and LL area when examined ?Digital anorectal examination was then performed to assure proper positioning of the band, and to adjust the banded tissue as required. ? The patient was discharged home without pain or other issues.  Dietary and behavioral recommendations were given and along with follow-up instructions.   ?  ?The following adjunctive treatments were recommended: ?Resume Linzess daily ? ?The patient will return in 2-4 weeks for  follow-up and possible additional banding as required. ?No complications were encountered and the patient tolerated the procedure well. ? ?Jolly Mango, MD ?[facility] ? ?

## 2022-03-15 ENCOUNTER — Encounter: Payer: Self-pay | Admitting: Gastroenterology

## 2022-03-15 DIAGNOSIS — L237 Allergic contact dermatitis due to plants, except food: Secondary | ICD-10-CM | POA: Diagnosis not present

## 2022-03-23 DIAGNOSIS — N3001 Acute cystitis with hematuria: Secondary | ICD-10-CM | POA: Diagnosis not present

## 2022-04-18 ENCOUNTER — Encounter: Payer: Self-pay | Admitting: Gastroenterology

## 2022-04-18 ENCOUNTER — Ambulatory Visit: Payer: Medicare HMO | Admitting: Gastroenterology

## 2022-04-18 VITALS — BP 100/48 | HR 68 | Ht 66.75 in | Wt 147.5 lb

## 2022-04-18 DIAGNOSIS — K642 Third degree hemorrhoids: Secondary | ICD-10-CM | POA: Diagnosis not present

## 2022-04-18 NOTE — Progress Notes (Signed)
51 year old female here for follow-up for hemorrhoid banding.  Recall she has had symptomatic hemorrhoids for a long time now, grade 3 prolapse with bleeding, swelling, pruritus.  She has had baseline constipation in the setting of this.  Is intolerant to MiraLAX.  She had been given a trial of Linzess 145 mcg/day. In general she has needed to use Linzess PRN, not needing to use it all the time.  ? ?LL banded on 1/19 ?Had another banding March 16th, RP area. ? ?She feels banding so far has really helped and she wants to proceed with final banding tonight.  ?She wishes to proceed with additional banding and continue the treatment at this time. ?  ?PROCEDURE NOTE: ?The patient presents with symptomatic grade III  hemorrhoids, requesting rubber band ligation of his/her hemorrhoidal disease.  All risks, benefits and alternative forms of therapy were described and informed consent was obtained. ?  ?The anorectum was pre-medicated with 0.125% nitroglycerin ?The decision was made to band the RA internal hemorrhoid, and the [place] O?Regan System was used to perform band ligation without complication. Digital anorectal examination was then performed to assure proper positioning of the band, and to adjust the banded tissue as required. ? The patient was discharged home without pain or other issues.  Dietary and behavioral recommendations were given and along with follow-up instructions.   ?  ?The following adjunctive treatments were recommended: ?Resume Linzess daily ?  ?The patient will return as needed moving forward, no other planned bandings at this time. ?No complications were encountered and the patient tolerated the procedure well. ?  ?Jolly Mango, MD ?[facility] ?

## 2022-04-18 NOTE — Patient Instructions (Signed)
If you are age 53 or older, your body mass index should be between 23-30. Your Body mass index is 23.28 kg/m?Marland Kitchen If this is out of the aforementioned range listed, please consider follow up with your Primary Care Provider. ? ?If you are age 55 or younger, your body mass index should be between 19-25. Your Body mass index is 23.28 kg/m?Marland Kitchen If this is out of the aformentioned range listed, please consider follow up with your Primary Care Provider.  ? ?________________________________________________________ ? ?The [facility] providers would like to encourage you to use [facility] to communicate with providers for non-urgent requests or questions.  Due to long hold times on the telephone, sending your provider a message by [facility] may be a faster and more efficient way to get a response.  Please allow 48 business hours for a response.  Please remember that this is for non-urgent requests.  ?_______________________________________________________ ? ?HEMORRHOID BANDING PROCEDURE  ? ? FOLLOW-UP CARE ? ? ?The procedure you have had should have been relatively painless since the banding of the area involved does not have nerve endings and there is no pain sensation.  The rubber band cuts off the blood supply to the hemorrhoid and the band may fall off as soon as 48 hours after the banding (the band may occasionally be seen in the toilet bowl following a bowel movement). You may notice a temporary feeling of fullness in the rectum which should respond adequately to plain Tylenol? or Motrin?. ? ?Following the banding, avoid strenuous exercise that evening and resume full activity the next day.  A sitz bath (soaking in a warm tub) or bidet is soothing, and can be useful for cleansing the area after bowel movements.   ? ? ?To avoid constipation, take two tablespoons of natural wheat bran, natural oat bran, flax, Benefiber? or any over the counter fiber supplement and increase your water intake to 7-8 glasses daily.   ? ?Unless you  have been prescribed anorectal medication, do not put anything inside your rectum for two weeks: No suppositories, enemas, fingers, etc. ? ?Occasionally, you may have more bleeding than usual after the banding procedure.  This is often from the untreated hemorrhoids rather than the treated one.  Don?t be concerned if there is a tablespoon or so of blood.  If there is more blood than this, lie flat with your bottom higher than your head and apply an ice pack to the area. If the bleeding does not stop within a half an hour or if you feel faint, call our office at [phone] or go to the emergency room. ? ?Problems are not common; however, if there is a substantial amount of bleeding, severe pain, chills, fever or difficulty passing urine (very rare) or other problems, you should call us at [phone] or report to the nearest emergency room. ? ?Do not stay seated continuously for more than 2-3 hours for a day or two after the procedure.  Tighten your buttock muscles 10-15 times every two hours and take 10-15 deep breaths every 1-2 hours.  Do not spend more than a few minutes on the toilet if you cannot empty your bowel; instead re-visit the toilet at a later time. ? ?  ?Thank you for entrusting me with your care and for choosing [facility], ?Dr. Mohave Cellar ? ? ?

## 2022-06-12 DIAGNOSIS — J45909 Unspecified asthma, uncomplicated: Secondary | ICD-10-CM | POA: Diagnosis not present

## 2022-06-12 DIAGNOSIS — J0100 Acute maxillary sinusitis, unspecified: Secondary | ICD-10-CM | POA: Diagnosis not present

## 2022-06-12 DIAGNOSIS — J309 Allergic rhinitis, unspecified: Secondary | ICD-10-CM | POA: Diagnosis not present

## 2022-09-29 DIAGNOSIS — N390 Urinary tract infection, site not specified: Secondary | ICD-10-CM | POA: Diagnosis not present

## 2023-02-20 DIAGNOSIS — Z0000 Encounter for general adult medical examination without abnormal findings: Secondary | ICD-10-CM | POA: Diagnosis not present

## 2023-02-20 DIAGNOSIS — Z13220 Encounter for screening for lipoid disorders: Secondary | ICD-10-CM | POA: Diagnosis not present

## 2023-03-11 DIAGNOSIS — M542 Cervicalgia: Secondary | ICD-10-CM | POA: Diagnosis not present

## 2023-03-11 DIAGNOSIS — M5412 Radiculopathy, cervical region: Secondary | ICD-10-CM | POA: Diagnosis not present

## 2023-03-11 DIAGNOSIS — M9902 Segmental and somatic dysfunction of thoracic region: Secondary | ICD-10-CM | POA: Diagnosis not present

## 2023-03-11 DIAGNOSIS — M9901 Segmental and somatic dysfunction of cervical region: Secondary | ICD-10-CM | POA: Diagnosis not present

## 2023-03-13 DIAGNOSIS — M542 Cervicalgia: Secondary | ICD-10-CM | POA: Diagnosis not present

## 2023-03-13 DIAGNOSIS — M9901 Segmental and somatic dysfunction of cervical region: Secondary | ICD-10-CM | POA: Diagnosis not present

## 2023-03-13 DIAGNOSIS — M5412 Radiculopathy, cervical region: Secondary | ICD-10-CM | POA: Diagnosis not present

## 2023-03-13 DIAGNOSIS — M9902 Segmental and somatic dysfunction of thoracic region: Secondary | ICD-10-CM | POA: Diagnosis not present

## 2023-03-17 DIAGNOSIS — M542 Cervicalgia: Secondary | ICD-10-CM | POA: Diagnosis not present

## 2023-03-17 DIAGNOSIS — M5412 Radiculopathy, cervical region: Secondary | ICD-10-CM | POA: Diagnosis not present

## 2023-03-17 DIAGNOSIS — M9901 Segmental and somatic dysfunction of cervical region: Secondary | ICD-10-CM | POA: Diagnosis not present

## 2023-03-17 DIAGNOSIS — M9902 Segmental and somatic dysfunction of thoracic region: Secondary | ICD-10-CM | POA: Diagnosis not present

## 2023-03-19 DIAGNOSIS — M5412 Radiculopathy, cervical region: Secondary | ICD-10-CM | POA: Diagnosis not present

## 2023-03-19 DIAGNOSIS — M9901 Segmental and somatic dysfunction of cervical region: Secondary | ICD-10-CM | POA: Diagnosis not present

## 2023-03-19 DIAGNOSIS — M9902 Segmental and somatic dysfunction of thoracic region: Secondary | ICD-10-CM | POA: Diagnosis not present

## 2023-03-19 DIAGNOSIS — M542 Cervicalgia: Secondary | ICD-10-CM | POA: Diagnosis not present

## 2023-03-24 DIAGNOSIS — M9902 Segmental and somatic dysfunction of thoracic region: Secondary | ICD-10-CM | POA: Diagnosis not present

## 2023-03-24 DIAGNOSIS — M9901 Segmental and somatic dysfunction of cervical region: Secondary | ICD-10-CM | POA: Diagnosis not present

## 2023-03-24 DIAGNOSIS — M5412 Radiculopathy, cervical region: Secondary | ICD-10-CM | POA: Diagnosis not present

## 2023-03-24 DIAGNOSIS — M542 Cervicalgia: Secondary | ICD-10-CM | POA: Diagnosis not present

## 2023-03-26 DIAGNOSIS — M542 Cervicalgia: Secondary | ICD-10-CM | POA: Diagnosis not present

## 2023-03-26 DIAGNOSIS — M9901 Segmental and somatic dysfunction of cervical region: Secondary | ICD-10-CM | POA: Diagnosis not present

## 2023-03-26 DIAGNOSIS — M5412 Radiculopathy, cervical region: Secondary | ICD-10-CM | POA: Diagnosis not present

## 2023-03-26 DIAGNOSIS — M9902 Segmental and somatic dysfunction of thoracic region: Secondary | ICD-10-CM | POA: Diagnosis not present

## 2023-05-21 ENCOUNTER — Encounter: Payer: Self-pay | Admitting: Emergency Medicine

## 2023-05-21 ENCOUNTER — Ambulatory Visit
Admission: EM | Admit: 2023-05-21 | Discharge: 2023-05-21 | Disposition: A | Discharge status: Home / Self Care | Payer: Medicare HMO | Attending: Physician Assistant | Admitting: Physician Assistant

## 2023-05-21 DIAGNOSIS — J322 Chronic ethmoidal sinusitis: Secondary | ICD-10-CM | POA: Diagnosis not present

## 2023-05-21 MED ORDER — AMOXICILLIN-POT CLAVULANATE 875-125 MG PO TABS: 1.0000 | ORAL_TABLET | Freq: Two times a day (BID) | ORAL | 0 refills | Status: DC

## 2023-05-21 MED ORDER — FLUCONAZOLE 150 MG PO TABS: 150.0000 mg | ORAL_TABLET | Freq: Every day | ORAL | 1 refills | Status: DC

## 2023-05-21 NOTE — Discharge Instructions (Addendum)
Return if any problems.

## 2023-05-21 NOTE — ED Triage Notes (Signed)
Nasal congestion x 3 days, scratchy throat, states neck hurts today.
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
## 2023-07-18 ENCOUNTER — Encounter (HOSPITAL_BASED_OUTPATIENT_CLINIC_OR_DEPARTMENT_OTHER): Payer: Self-pay | Admitting: Emergency Medicine

## 2023-07-18 ENCOUNTER — Emergency Department (HOSPITAL_BASED_OUTPATIENT_CLINIC_OR_DEPARTMENT_OTHER): Payer: Medicare HMO

## 2023-07-18 ENCOUNTER — Other Ambulatory Visit: Payer: Self-pay

## 2023-07-18 DIAGNOSIS — W2107XA Struck by softball, initial encounter: Secondary | ICD-10-CM | POA: Insufficient documentation

## 2023-07-18 DIAGNOSIS — J45909 Unspecified asthma, uncomplicated: Secondary | ICD-10-CM | POA: Insufficient documentation

## 2023-07-18 DIAGNOSIS — S0083XA Contusion of other part of head, initial encounter: Secondary | ICD-10-CM | POA: Diagnosis not present

## 2023-07-18 DIAGNOSIS — S0990XA Unspecified injury of head, initial encounter: Secondary | ICD-10-CM | POA: Diagnosis present

## 2023-07-18 NOTE — ED Triage Notes (Signed)
Pt presents to ED POV. Pt c/o facial injury this evening by softball. Pt denies LOC, denies blood thinners. Pt reports nausea, dizziness, and light headedness. Pupils are equal and reactive

## 2023-07-19 ENCOUNTER — Emergency Department (HOSPITAL_BASED_OUTPATIENT_CLINIC_OR_DEPARTMENT_OTHER)
Admission: EM | Admit: 2023-07-19 | Discharge: 2023-07-19 | Disposition: A | Discharge status: Home / Self Care | Payer: Medicare HMO | Attending: Emergency Medicine | Admitting: Emergency Medicine

## 2023-07-19 DIAGNOSIS — S0993XA Unspecified injury of face, initial encounter: Secondary | ICD-10-CM

## 2023-07-19 MED ORDER — FLUORESCEIN SODIUM 1 MG OP STRP
ORAL_STRIP | OPHTHALMIC | Status: AC
  Filled 2023-07-19: qty 1

## 2023-07-19 MED ORDER — ONDANSETRON 4 MG PO TBDP: 4.0000 mg | ORAL_TABLET | Freq: Three times a day (TID) | ORAL | 0 refills | Status: AC | PRN

## 2023-07-19 MED ORDER — ACETAMINOPHEN 500 MG PO TABS
1000.0000 mg | ORAL_TABLET | Freq: Once | ORAL | Status: AC
  Administered 2023-07-19: 1000 mg via ORAL
  Filled 2023-07-19: qty 2

## 2023-07-19 MED ORDER — FLUORESCEIN SODIUM 1 MG OP STRP
1.0000 | ORAL_STRIP | Freq: Once | OPHTHALMIC | Status: AC
  Administered 2023-07-19: 1 via OPHTHALMIC

## 2023-07-19 MED ORDER — TETRACAINE HCL 0.5 % OP SOLN
OPHTHALMIC | Status: AC
  Administered 2023-07-19: 1 [drp]
  Filled 2023-07-19: qty 4

## 2023-07-19 MED ORDER — ONDANSETRON 4 MG PO TBDP
4.0000 mg | ORAL_TABLET | Freq: Once | ORAL | Status: AC
  Administered 2023-07-19: 4 mg via ORAL
  Filled 2023-07-19: qty 1
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
## 2023-08-27 ENCOUNTER — Other Ambulatory Visit (HOSPITAL_COMMUNITY): Payer: Self-pay

## 2023-12-05 ENCOUNTER — Other Ambulatory Visit: Payer: Self-pay | Admitting: Medical Genetics

## 2023-12-11 ENCOUNTER — Other Ambulatory Visit (HOSPITAL_COMMUNITY): Payer: Medicare HMO | Attending: Medical Genetics

## 2024-01-27 ENCOUNTER — Ambulatory Visit
Admission: EM | Admit: 2024-01-27 | Discharge: 2024-01-27 | Disposition: A | Discharge status: Home / Self Care | Payer: No Typology Code available for payment source | Attending: Nurse Practitioner | Admitting: Nurse Practitioner

## 2024-01-27 ENCOUNTER — Ambulatory Visit: Payer: Medicare HMO | Admitting: Family

## 2024-01-27 ENCOUNTER — Ambulatory Visit: Payer: Self-pay | Admitting: Physician Assistant

## 2024-01-27 DIAGNOSIS — J069 Acute upper respiratory infection, unspecified: Secondary | ICD-10-CM

## 2024-01-27 DIAGNOSIS — Z20828 Contact with and (suspected) exposure to other viral communicable diseases: Secondary | ICD-10-CM | POA: Diagnosis not present

## 2024-01-27 LAB — POCT INFLUENZA A/B
Influenza A, POC: NEGATIVE
Influenza B, POC: NEGATIVE

## 2024-01-27 LAB — POCT RAPID STREP A (OFFICE): Rapid Strep A Screen: NEGATIVE

## 2024-01-27 MED ORDER — OSELTAMIVIR PHOSPHATE 75 MG PO CAPS: 75.0000 mg | ORAL_CAPSULE | Freq: Two times a day (BID) | ORAL | 0 refills | Status: AC

## 2024-01-27 MED ORDER — ONDANSETRON 4 MG PO TBDP: 4.0000 mg | ORAL_TABLET | Freq: Three times a day (TID) | ORAL | 0 refills | Status: DC | PRN

## 2024-01-27 NOTE — ED Triage Notes (Signed)
Pt reports she has a sore throat, dizzy, nauseas, and fatigue x 3 days       Has been exposed to strep and flu A x 4 days ago husband has flu A currently

## 2024-01-27 NOTE — Discharge Instructions (Addendum)
The influenza and strep throat test are negative today.  Your symptoms are consistent with a viral illness.  Since you have been exposed to the flu, we have given you a prescription for Tamiflu today to treat for the flu.  Continue Tylenol as needed for pain.  In addition, you can take Zofran every 8 hours as needed for nausea/vomiting.  Seek care if symptoms do not improve or worsen despite treatment.
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
## 2024-01-27 NOTE — Telephone Encounter (Signed)
Copied from CRM [id number]. Topic: Clinical - Red Word Triage >> Jan 27, 2024  8:34 AM Prudencio Pair wrote: Red Word that prompted transfer to Nurse Triage: Patient trying to see a provider today. No one at [facility] has anything for today or tomorrow. Patient states she is nauseous, sore throat, coughing. She was exposed to flu & strep.   Chief Complaint: Nausea Symptoms:  Intermittent headache, throat scratchy/sore, chills, nausea, body aches, cough Pertinent Negatives: Patient denies fever Disposition: [] ED /[] Urgent Care (no appt availability in office) / [x] Appointment(In office/virtual)/ []  [facility]/ [] Home Care/ [] Refused Recommended Disposition /[] [facility]/ []  Follow-up with PCP  Additional Notes: Patient reported feeling sick for a few days. She is having nausea, intermittent headache, throat scratchy/sore, chills, nausea, body aches, dry cough. Same day appointment scheduled.   Reason for Disposition  [1] Sore throat with cough/cold symptoms AND [2] present > 5 days    Has not been 5 days, however patient is concerned about possible strep  Answer Assessment - Initial Assessment Questions 1. 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 HIGH RISK for COMPLICATIONS: "Do you have any heart or lung problems?" "Do you have a weakened immune system?" (e.g., CHF, COPD, asthma, HIV positive, chemotherapy, renal failure, diabetes mellitus, sickle cell anemia)     Asthma, (controlled in past several years), Patient has no SOB or breathing difficulty at this time  3. SYMPTOMS: "Do you have any symptoms?" (e.g., cough, fever, sore throat, difficulty breathing).     Intermittent headache, throat scratchy, chills, nausea, body aches, cough,  Answer Assessment - Initial Assessment Questions 1. SEVERITY: "How bad is the sore throat?"  (Scale 1-10; mild, moderate or severe)   - MILD (1-3):  Doesn't interfere with eating or normal activities.   - MODERATE (4-7): Interferes with eating some solids and normal activities.   - SEVERE (8-10):  Excruciating pain, interferes with most normal activities.   - SEVERE WITH DYSPHAGIA (10): Can't swallow liquids, drooling.     Mild. Patient states it doesn't feel as if she has strep, but she wants to be cautious  2.  VIRAL SYMPTOMS: "Are there any symptoms of a cold, such as a runny nose, cough, hoarse voice or red eyes?"      Cough  3. FEVER: "Do you have a fever?" If Yes, ask: "What is your temperature, how was it measured, and when did it start?"     No  4. PUS ON THE TONSILS: "Is there pus on the tonsils in the back of your throat?"     Patient reported redness  8. OTHER SYMPTOMS: "Do you have any other symptoms?" (e.g., difficulty breathing, headache, rash)        Intermittent headache, throat scratchy/sore, chills, nausea, body aches, cough,  Protocols used: Influenza (Flu) Exposure-A-AH, Sore Throat-A-AH

## 2024-02-03 ENCOUNTER — Encounter: Payer: Self-pay | Admitting: Nurse Practitioner

## 2024-02-03 ENCOUNTER — Ambulatory Visit: Payer: PPO | Admitting: Nurse Practitioner

## 2024-02-03 VITALS — BP 124/71 | HR 50 | Temp 97.2°F | Ht 66.0 in | Wt 162.0 lb

## 2024-02-03 DIAGNOSIS — J069 Acute upper respiratory infection, unspecified: Secondary | ICD-10-CM

## 2024-02-03 MED ORDER — AZITHROMYCIN 250 MG PO TABS: ORAL_TABLET | ORAL | 0 refills | Status: DC

## 2024-02-03 MED ORDER — PROMETHAZINE-DM 6.25-15 MG/5ML PO SYRP: 5.0000 mL | ORAL_SOLUTION | Freq: Four times a day (QID) | ORAL | 0 refills | Status: DC | PRN

## 2024-02-03 MED ORDER — FLUCONAZOLE 150 MG PO TABS: 150.0000 mg | ORAL_TABLET | Freq: Once | ORAL | 0 refills | Status: AC

## 2024-02-03 NOTE — Addendum Note (Signed)
Addended by: Cleda Daub on: 02/03/2024 12:06 PM   Modules accepted: Orders

## 2024-02-03 NOTE — Progress Notes (Signed)
 Subjective:    Patient ID: [name], female    DOB: Nov 02, 1971, 53 y.o.   MRN: [id number]   Chief Complaint: flu like symptoms  Headache  Associated symptoms include coughing (slight but is better) and ear pain. Pertinent negatives include no fever.    Patient tested positive for flu last week. Was was prescribed tamiflu , but she did not take it. Still does not feel well. Her main issue now is bil ear pain without discharge and congestion. Still running a fever of 100 yesterday.  Patient Active Problem List   Diagnosis Date Noted   PTSD (post-traumatic stress disorder)    OCD (obsessive compulsive disorder)    History of concussion 03/21/2020   ADHD (attention deficit hyperactivity disorder), inattentive type 09/05/2017   Lumbar facet arthropathy 08/05/2017   Spasm of lumbar paraspinous muscle 06/11/2017   Lumbosacral disc herniation 06/02/2017   Spinal stenosis of lumbar region with neurogenic claudication 05/07/2017   Vascular insufficiency 05/07/2017   Plantar fasciitis of left foot 01/09/2016   Bursitis of left shoulder 09/11/2015   Fibromyalgia 01/10/2015   Thyroid  nodule 11/29/2014   Pinched vertebral nerve 11/21/2014   Acute sinusitis, unspecified 02/08/2009   Generalized anxiety disorder 09/08/2008   Hyperthyroidism 08/21/2008   Chest pain, unspecified 08/21/2008   Coccygeal pain 12/29/2007   Goiter 09/01/2007   Anemia, unspecified 09/01/2007   Major depressive disorder 09/01/2007   Asthma 09/01/2007   Syncope 09/01/2007       Review of Systems  Constitutional:  Negative for chills and fever.  HENT:  Positive for congestion and ear pain.   Respiratory:  Positive for cough (slight but is better).   Cardiovascular: Negative.   Neurological:  Positive for headaches.       Objective:   Physical Exam Vitals and nursing note reviewed.  Constitutional:      General: She is not in acute distress.    Appearance: Normal appearance. She is well-developed.   HENT:     Head: Normocephalic.     Right Ear: Tympanic membrane normal.     Left Ear: Tympanic membrane normal.     Nose: Congestion and rhinorrhea present.     Right Sinus: Frontal sinus tenderness present.     Left Sinus: Frontal sinus tenderness present.     Mouth/Throat:     Mouth: Mucous membranes are moist.  Eyes:     Pupils: Pupils are equal, round, and reactive to light.  Neck:     Vascular: No carotid bruit or JVD.  Cardiovascular:     Rate and Rhythm: Normal rate and regular rhythm.     Heart sounds: Normal heart sounds.  Pulmonary:     Effort: Pulmonary effort is normal. No respiratory distress.     Breath sounds: Normal breath sounds. No wheezing or rales.  Chest:     Chest wall: No tenderness.  Abdominal:     General: Bowel sounds are normal. There is no distension or abdominal bruit.     Palpations: Abdomen is soft. There is no hepatomegaly, splenomegaly, mass or pulsatile mass.     Tenderness: There is no abdominal tenderness.  Musculoskeletal:        General: Normal range of motion.     Cervical back: Normal range of motion and neck supple.  Lymphadenopathy:     Cervical: No cervical adenopathy.  Skin:    General: Skin is warm and dry.  Neurological:     Mental Status: She is alert and oriented to person,  place, and time.     Deep Tendon Reflexes: Reflexes are normal and symmetric.  Psychiatric:        Behavior: Behavior normal.        Thought Content: Thought content normal.        Judgment: Judgment normal.       BP 124/71   Pulse (!) 50   Temp (!) 97.2 F (36.2 C) (Temporal)   Ht 5' 6 (1.676 m)   Wt 162 lb (73.5 kg)   SpO2 100%   BMI 26.15 kg/m      Assessment & Plan:   [name] in today with chief complaint of Nausea (Had flu a week ago/) and Headache (Ear pressure/)   1. URI with cough and congestion (Primary) 1. Take meds as prescribed 2. Use a cool mist humidifier especially during the winter months and when heat has been  humid. 3. Use saline nose sprays frequently 4. Saline irrigations of the nose can be very helpful if done frequently.  * 4X daily for 1 week*  * Use of a nettie pot can be helpful with this. Follow directions with this* 5. Drink plenty of fluids 6. Keep thermostat turn down low 7.For any cough or congestion- promethazine  DM 8. For fever or aces or pains- take tylenol  or ibuprofen appropriate for age and weight.  * for fevers greater than 101 orally you may alternate ibuprofen and tylenol  every  3 hours.    Meds ordered this encounter  Medications   azithromycin  (ZITHROMAX  Z-PAK) 250 MG tablet    Sig: As directed    Dispense:  6 tablet    Refill:  0    Supervising Provider:   MARYANNE CHEW A [[id number]]   promethazine -dextromethorphan (PROMETHAZINE -DM) 6.25-15 MG/5ML syrup    Sig: Take 5 mLs by mouth 4 (four) times daily as needed for cough.    Dispense:  118 mL    Refill:  0    Supervising Provider:   MARYANNE CHEW A [[id number]]      The above assessment and management plan was discussed with the patient. The patient verbalized understanding of and has agreed to the management plan. Patient is aware to call the clinic if symptoms persist or worsen. Patient is aware when to return to the clinic for a follow-up visit. Patient educated on when it is appropriate to go to the emergency department.   Mary-Margaret Gladis, FNP

## 2024-02-03 NOTE — Patient Instructions (Addendum)
1. Take meds as prescribed 2. Use a cool mist humidifier especially during the winter months and when heat has been humid. 3. Use saline nose sprays frequently 4. Saline irrigations of the nose can be very helpful if done frequently.  * 4X daily for 1 week*  * Use of a nettie pot can be helpful with this. Follow directions with this* 5. Drink plenty of fluids 6. Keep thermostat turn down low 7.For any cough or congestion- promethazine dm 8. For fever or aces or pains- take tylenol or ibuprofen appropriate for age and weight.  * for fevers greater than 101 orally you may alternate ibuprofen and tylenol every  3 hours.

## 2024-02-26 ENCOUNTER — Ambulatory Visit: Payer: PPO | Admitting: Nurse Practitioner

## 2024-02-26 ENCOUNTER — Ambulatory Visit: Payer: No Typology Code available for payment source | Admitting: Family Medicine

## 2024-02-26 ENCOUNTER — Encounter: Payer: Self-pay | Admitting: Family Medicine

## 2024-02-26 VITALS — BP 96/60 | HR 54 | Temp 98.3°F | Ht 66.0 in | Wt 165.0 lb

## 2024-02-26 DIAGNOSIS — E0590 Thyrotoxicosis, unspecified without thyrotoxic crisis or storm: Secondary | ICD-10-CM

## 2024-02-26 DIAGNOSIS — E041 Nontoxic single thyroid nodule: Secondary | ICD-10-CM

## 2024-02-26 DIAGNOSIS — Z1159 Encounter for screening for other viral diseases: Secondary | ICD-10-CM

## 2024-02-26 DIAGNOSIS — Z136 Encounter for screening for cardiovascular disorders: Secondary | ICD-10-CM

## 2024-02-26 DIAGNOSIS — F411 Generalized anxiety disorder: Secondary | ICD-10-CM

## 2024-02-26 DIAGNOSIS — I998 Other disorder of circulatory system: Secondary | ICD-10-CM

## 2024-02-26 DIAGNOSIS — Z1231 Encounter for screening mammogram for malignant neoplasm of breast: Secondary | ICD-10-CM | POA: Diagnosis not present

## 2024-02-26 DIAGNOSIS — F330 Major depressive disorder, recurrent, mild: Secondary | ICD-10-CM | POA: Diagnosis not present

## 2024-02-26 DIAGNOSIS — F4310 Post-traumatic stress disorder, unspecified: Secondary | ICD-10-CM

## 2024-02-26 DIAGNOSIS — J4520 Mild intermittent asthma, uncomplicated: Secondary | ICD-10-CM

## 2024-02-26 DIAGNOSIS — E559 Vitamin D deficiency, unspecified: Secondary | ICD-10-CM | POA: Insufficient documentation

## 2024-02-26 DIAGNOSIS — L989 Disorder of the skin and subcutaneous tissue, unspecified: Secondary | ICD-10-CM | POA: Diagnosis not present
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
## 2024-02-27 LAB — CBC WITH DIFFERENTIAL/PLATELET
Basophils Absolute: 0 10*3/uL (ref 0.0–0.2)
Basos: 0 %
EOS (ABSOLUTE): 0.1 10*3/uL (ref 0.0–0.4)
Eos: 3 %
Hematocrit: 37.9 % (ref 34.0–46.6)
Hemoglobin: 12.2 g/dL (ref 11.1–15.9)
Immature Grans (Abs): 0 10*3/uL (ref 0.0–0.1)
Immature Granulocytes: 0 %
Lymphocytes Absolute: 2.2 10*3/uL (ref 0.7–3.1)
Lymphs: 45 %
MCH: 29.2 pg (ref 26.6–33.0)
MCHC: 32.2 g/dL (ref 31.5–35.7)
MCV: 91 fL (ref 79–97)
Monocytes Absolute: 0.4 10*3/uL (ref 0.1–0.9)
Monocytes: 8 %
Neutrophils Absolute: 2.2 10*3/uL (ref 1.4–7.0)
Neutrophils: 44 %
Platelets: 142 10*3/uL — ABNORMAL LOW (ref 150–450)
RBC: 4.18 x10E6/uL (ref 3.77–5.28)
RDW: 13 % (ref 11.7–15.4)
WBC: 4.9 10*3/uL (ref 3.4–10.8)

## 2024-02-27 LAB — HEPB+HEPC+HIV PANEL
HIV Screen 4th Generation wRfx: NONREACTIVE
Hep B C IgM: NEGATIVE
Hep B Core Total Ab: NEGATIVE
Hep B E Ab: NONREACTIVE
Hep B E Ag: NEGATIVE
Hep B Surface Ab, Qual: NONREACTIVE
Hep C Virus Ab: NONREACTIVE
Hepatitis B Surface Ag: NEGATIVE

## 2024-02-27 LAB — CMP14+EGFR
ALT: 15 IU/L (ref 0–32)
AST: 15 IU/L (ref 0–40)
Albumin: 4.4 g/dL (ref 3.8–4.9)
Alkaline Phosphatase: 48 IU/L (ref 44–121)
BUN/Creatinine Ratio: 44 — ABNORMAL HIGH (ref 9–23)
BUN: 25 mg/dL — ABNORMAL HIGH (ref 6–24)
Bilirubin Total: 0.3 mg/dL (ref 0.0–1.2)
CO2: 24 mmol/L (ref 20–29)
Calcium: 9.2 mg/dL (ref 8.7–10.2)
Chloride: 103 mmol/L (ref 96–106)
Creatinine, Ser: 0.57 mg/dL (ref 0.57–1.00)
Globulin, Total: 2.1 g/dL (ref 1.5–4.5)
Glucose: 88 mg/dL (ref 70–99)
Potassium: 4.1 mmol/L (ref 3.5–5.2)
Sodium: 141 mmol/L (ref 134–144)
Total Protein: 6.5 g/dL (ref 6.0–8.5)
eGFR: 108 mL/min/{1.73_m2} (ref >59)

## 2024-02-27 LAB — LIPID PANEL
Chol/HDL Ratio: 2 ratio (ref 0.0–4.4)
Cholesterol, Total: 166 mg/dL (ref 100–199)
HDL: 84 mg/dL (ref >39)
LDL Chol Calc (NIH): 73 mg/dL (ref 0–99)
Triglycerides: 40 mg/dL (ref 0–149)
VLDL Cholesterol Cal: 9 mg/dL (ref 5–40)

## 2024-02-27 LAB — THYROID PANEL WITH TSH
Free Thyroxine Index: 1.9 (ref 1.2–4.9)
T3 Uptake Ratio: 29 % (ref 24–39)
T4, Total: 6.7 ug/dL (ref 4.5–12.0)
TSH: 0.626 u[IU]/mL (ref 0.450–4.500)

## 2024-02-27 LAB — THYROID PEROXIDASE ANTIBODY: Thyroperoxidase Ab SerPl-aCnc: 18 [IU]/mL (ref 0–34)

## 2024-03-02 ENCOUNTER — Encounter: Payer: Self-pay | Admitting: Family Medicine

## 2024-03-02 NOTE — Progress Notes (Signed)
 Slightly decreased platelets, would like to repeat in 1-2 months. Slight variation on CMP, recommend adequate hydration of 80-100 oz of water daily.  Cholesterol levels look awesome and Risk score is very low. The 10-year ASCVD risk score (Arnett DK, et al., 2019) is: 0.6%. Non reactive to Hep B. Can schedule with Triage RN if she would like booster of vaccine.

## 2024-03-08 ENCOUNTER — Ambulatory Visit
Admission: RE | Admit: 2024-03-08 | Discharge: 2024-03-08 | Disposition: A | Discharge status: Home / Self Care | Payer: No Typology Code available for payment source | Source: Ambulatory Visit | Attending: Family Medicine | Admitting: Family Medicine

## 2024-03-08 DIAGNOSIS — Z1231 Encounter for screening mammogram for malignant neoplasm of breast: Secondary | ICD-10-CM

## 2024-03-12 ENCOUNTER — Encounter: Payer: Self-pay | Admitting: Family Medicine

## 2024-03-12 NOTE — Progress Notes (Signed)
Normal mammogram. Repeat in one year

## 2024-03-13 ENCOUNTER — Ambulatory Visit
Admission: EM | Admit: 2024-03-13 | Discharge: 2024-03-13 | Disposition: A | Discharge status: Home / Self Care | Attending: Nurse Practitioner | Admitting: Nurse Practitioner

## 2024-03-13 DIAGNOSIS — H6993 Unspecified Eustachian tube disorder, bilateral: Secondary | ICD-10-CM | POA: Diagnosis not present

## 2024-03-13 DIAGNOSIS — J029 Acute pharyngitis, unspecified: Secondary | ICD-10-CM

## 2024-03-13 LAB — POCT RAPID STREP A (OFFICE): Rapid Strep A Screen: NEGATIVE

## 2024-03-13 MED ORDER — PHENYLEPHRINE HCL 10 MG PO TABS: 10.0000 mg | ORAL_TABLET | Freq: Three times a day (TID) | ORAL | 0 refills | Status: DC | PRN

## 2024-03-13 MED ORDER — PREDNISONE 10 MG PO TABS: 10.0000 mg | ORAL_TABLET | Freq: Every day | ORAL | 0 refills | Status: AC

## 2024-03-13 NOTE — ED Triage Notes (Addendum)
 Pt reports she has throat pain, ear pain, and neck pain x 2 days    2 home coivd and flu test were negative.
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
## 2024-03-13 NOTE — Discharge Instructions (Signed)
 The rapid strep test was negative, a throat culture is pending.  You will be contacted if the pending test result is abnormal.  You also access to the results via MyChart. Take medication as prescribed. Increase fluids and allow for plenty of rest. May take over-the-counter Tylenol as needed for pain, fever, or general discomfort. Warm compresses to the ears that with pain or discomfort. Gentle range of motion exercises for your neck to help with pain or discomfort. May apply heat to help with your neck pain.  Apply for 20 minutes, remove for 1 hour, repeat as needed. As discussed, if symptoms fail to improve with this treatment, it is recommended that you follow-up with your primary care physician for further evaluation. Follow-up as needed.

## 2024-03-16 ENCOUNTER — Ambulatory Visit: Payer: No Typology Code available for payment source | Admitting: Family Medicine

## 2024-03-16 LAB — CULTURE, GROUP A STREP (THRC)

## 2024-03-19 ENCOUNTER — Ambulatory Visit: Admitting: Family Medicine

## 2024-03-23 ENCOUNTER — Ambulatory Visit: Admitting: Family Medicine

## 2024-03-23 ENCOUNTER — Other Ambulatory Visit (HOSPITAL_COMMUNITY)
Admission: RE | Admit: 2024-03-23 | Discharge: 2024-03-23 | Disposition: A | Discharge status: Home / Self Care | Source: Ambulatory Visit | Attending: Family Medicine | Admitting: Family Medicine

## 2024-03-23 VITALS — BP 93/58 | HR 57 | Temp 98.3°F | Ht 66.0 in | Wt 166.0 lb

## 2024-03-23 DIAGNOSIS — D492 Neoplasm of unspecified behavior of bone, soft tissue, and skin: Secondary | ICD-10-CM | POA: Diagnosis not present

## 2024-03-23 DIAGNOSIS — L821 Other seborrheic keratosis: Secondary | ICD-10-CM | POA: Diagnosis not present

## 2024-03-23 DIAGNOSIS — D485 Neoplasm of uncertain behavior of skin: Secondary | ICD-10-CM | POA: Diagnosis not present
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
## 2024-03-24 ENCOUNTER — Encounter: Payer: Self-pay | Admitting: Family Medicine

## 2024-03-24 LAB — SURGICAL PATHOLOGY

## 2024-03-24 NOTE — Progress Notes (Signed)
 Seborrheic keratosis. Recommend patient follow up as needed.

## 2024-03-26 ENCOUNTER — Other Ambulatory Visit: Payer: Self-pay

## 2024-03-26 DIAGNOSIS — I998 Other disorder of circulatory system: Secondary | ICD-10-CM

## 2024-04-02 ENCOUNTER — Encounter

## 2024-04-02 ENCOUNTER — Ambulatory Visit: Payer: Self-pay

## 2024-04-02 ENCOUNTER — Telehealth: Admitting: Family Medicine

## 2024-04-02 DIAGNOSIS — L089 Local infection of the skin and subcutaneous tissue, unspecified: Secondary | ICD-10-CM

## 2024-04-02 DIAGNOSIS — Z9889 Other specified postprocedural states: Secondary | ICD-10-CM

## 2024-04-02 MED ORDER — SULFAMETHOXAZOLE-TRIMETHOPRIM 800-160 MG PO TABS: 1.0000 | ORAL_TABLET | Freq: Two times a day (BID) | ORAL | 0 refills | Status: AC

## 2024-04-02 NOTE — Progress Notes (Signed)
 Virtual Visit Consent   [name], you are scheduled for a virtual visit with a [facility] provider today. Just as with appointments in the office, your consent must be obtained to participate. Your consent will be active for this visit and any virtual visit you may have with one of our providers in the next 365 days. If you have a MyChart account, a copy of this consent can be sent to you electronically.  As this is a virtual visit, video technology does not allow for your provider to perform a traditional examination. This may limit your provider's ability to fully assess your condition. If your provider identifies any concerns that need to be evaluated in person or the need to arrange testing (such as labs, EKG, etc.), we will make arrangements to do so. Although advances in technology are sophisticated, we cannot ensure that it will always work on either your end or our end. If the connection with a video visit is poor, the visit may have to be switched to a telephone visit. With either a video or telephone visit, we are not always able to ensure that we have a secure connection.  By engaging in this virtual visit, you consent to the provision of healthcare and authorize for your insurance to be billed (if applicable) for the services provided during this visit. Depending on your insurance coverage, you may receive a charge related to this service.  I need to obtain your verbal consent now. Are you willing to proceed with your visit today? [name] has provided verbal consent on 04/02/2024 for a virtual visit (video or telephone). Georgana Curio, FNP  Date: 04/02/2024 5:37 PM   Virtual Visit via Video Note   I, Georgana Curio, connected with  [name]  ([id number], 1971/05/04) on 04/02/24 at  5:45 PM EDT by a video-enabled telemedicine application and verified that I am speaking with the correct person using two identifiers.  Location: Patient: Virtual Visit Location Patient:  Home Provider: Virtual Visit Location Provider: Home Office   I discussed the limitations of evaluation and management by telemedicine and the availability of in person appointments. The patient expressed understanding and agreed to proceed.    History of Present Illness: [name] is a 53 y.o. who identifies as a female who was assigned female at birth, and is being seen today for biopsy over a week ago to posterior rt shoulder region now red and draining red purulent material. No fever. Marland Kitchen  HPI: HPI  Problems:  Patient Active Problem List   Diagnosis Date Noted   Vitamin D deficiency 02/26/2024   PTSD (post-traumatic stress disorder)    OCD (obsessive compulsive disorder)    Attention deficit disorder 09/05/2017   Lumbar facet arthropathy 08/05/2017   Spasm of lumbar paraspinous muscle 06/11/2017   Lumbosacral disc herniation 06/02/2017   Spinal stenosis of lumbar region with neurogenic claudication 05/07/2017   Vascular insufficiency 05/07/2017   Plantar fasciitis of left foot 01/09/2016   Bursitis of left shoulder 09/11/2015   Other chronic pain 01/10/2015   Thyroid nodule 11/29/2014   Pinched vertebral nerve 11/21/2014   Generalized anxiety disorder 09/08/2008   Hyperthyroidism 08/21/2008   Coccygeal pain 12/29/2007   Goiter 09/01/2007   Anemia, unspecified 09/01/2007   Major depressive disorder, single episode, unspecified 09/01/2007   Asthma 09/01/2007   Syncope 09/01/2007    Allergies:  Allergies  Allergen Reactions   Rocephin [Ceftriaxone] Anaphylaxis   Ceftriaxone Sodium     REACTION:  unspecified   Gabapentin Swelling   Medications:  Current Outpatient Medications:    albuterol (VENTOLIN HFA) 108 (90 Base) MCG/ACT inhaler, 2 puffs as needed for wheezing or shortness of breath Inhalation every 4 hrs, Disp: , Rfl:    CALCIUM-VITAMIN D PO, Take 1 tablet by mouth daily., Disp: , Rfl:   Observations/Objective: Patient is well-developed, well-nourished in no  acute distress.  Resting comfortably  at home.  Head is normocephalic, atraumatic.  No labored breathing.  Speech is clear and coherent with logical content.  Patient is alert and oriented at baseline.    Assessment and Plan: 1. S/P skin biopsy (Primary)  2. Skin infection  Keep wound clean with warm soapy water, dry well and continue to apply vaseline as instructed. F/U at [facility] or with derm if sx persist or worsen.   Follow Up Instructions: I discussed the assessment and treatment plan with the patient. The patient was provided an opportunity to ask questions and all were answered. The patient agreed with the plan and demonstrated an understanding of the instructions.  A copy of instructions were sent to the patient via MyChart unless otherwise noted below.     The patient was advised to call back or seek an in-person evaluation if the symptoms worsen or if the condition fails to improve as anticipated.    Georgana Curio, FNP

## 2024-04-02 NOTE — Patient Instructions (Signed)

## 2024-04-02 NOTE — Telephone Encounter (Signed)
  Chief Complaint: Infected biopsy site. Symptoms: red, warm pus filled Frequency: yesterday Pertinent Negatives: Patient denies fever Disposition: [] ED /[] Urgent Care (no appt availability in office) / [] Appointment(In office/virtual)/ [x]  [facility]/ [] Home Care/ [] Refused Recommended Disposition /[]  Mobile Bus/ []  Follow-up with PCP Additional Notes: Pt had a skin biopsy on 03/23/2024 on right upper back. Biopsy was the size of a nickel. Area is now red, warm and is oozing pus. [facility] scheduled for this afternoon.  Copied from CRM [id number]. Topic: Clinical - Red Word Triage >> Apr 02, 2024  4:35 PM Fredrica W wrote: Red Word that prompted transfer to Nurse Triage: Had biopsy - think area is infected red around it and a lot of puss Reason for Disposition  [1] Looks infected (spreading redness, red streak) AND [2] no fever  Answer Assessment - Initial Assessment Questions 1. LOCATION: "Where is the wound located?"      Upper right back 2. WOUND APPEARANCE: "What does the wound look like?"      Pus filled 3. SIZE: If redness is present, ask: "What is the size of the red area?" (Inches, centimeters, or compare to size of a coin)      unsure 4. SPREAD: "What's changed in the last day?"  "Do you see any red streaks coming from the wound?"     Pus - red around it  - inside is white and pu 5. ONSET: "When did it start to look infected?"      Last night 6. MECHANISM: "How did the wound start, what was the cause?"     Biopsy 7. PAIN: Do you have any pain?"  If Yes, ask: "How bad is the pain?"  (e.g., Scale 1-10; mild, moderate, or severe)    - MILD (1-3): Doesn't interfere with normal activities.     - MODERATE (4-7): Interferes with normal activities or awakens from sleep.    - SEVERE (8-10): Excruciating pain, unable to do any normal activities.       moderate 8. FEVER: "Do you have a fever?" If Yes, ask: "What is your temperature, how was it measured, and when did it  start?"     no 9. OTHER SYMPTOMS: "Do you have any other symptoms?" (e.g., shaking chills, weakness, rash elsewhere on body)     no  Protocols used: Wound Infection Suspected-A-AH

## 2024-04-06 ENCOUNTER — Ambulatory Visit (HOSPITAL_COMMUNITY)
Admission: RE | Admit: 2024-04-06 | Discharge: 2024-04-06 | Disposition: A | Discharge status: Home / Self Care | Source: Ambulatory Visit | Attending: Vascular Surgery | Admitting: Vascular Surgery

## 2024-04-06 DIAGNOSIS — I998 Other disorder of circulatory system: Secondary | ICD-10-CM | POA: Diagnosis not present

## 2024-04-06 NOTE — Telephone Encounter (Signed)
 Will have patient send picture of site.

## 2024-04-14 ENCOUNTER — Ambulatory Visit: Admitting: Physician Assistant

## 2024-04-14 VITALS — BP 125/74 | HR 55 | Temp 97.6°F | Ht 66.0 in | Wt 166.9 lb

## 2024-04-14 DIAGNOSIS — I872 Venous insufficiency (chronic) (peripheral): Secondary | ICD-10-CM

## 2024-04-14 DIAGNOSIS — M7989 Other specified soft tissue disorders: Secondary | ICD-10-CM
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
## 2024-05-25 ENCOUNTER — Ambulatory Visit (INDEPENDENT_AMBULATORY_CARE_PROVIDER_SITE_OTHER): Admitting: Family Medicine

## 2024-05-25 ENCOUNTER — Encounter: Payer: Self-pay | Admitting: Family Medicine

## 2024-05-25 VITALS — BP 107/68 | HR 58 | Temp 96.8°F | Ht 66.0 in | Wt 169.6 lb

## 2024-05-25 DIAGNOSIS — Z0001 Encounter for general adult medical examination with abnormal findings: Secondary | ICD-10-CM | POA: Diagnosis not present

## 2024-05-25 DIAGNOSIS — Z1283 Encounter for screening for malignant neoplasm of skin: Secondary | ICD-10-CM | POA: Diagnosis not present

## 2024-05-25 DIAGNOSIS — R7989 Other specified abnormal findings of blood chemistry: Secondary | ICD-10-CM

## 2024-05-25 DIAGNOSIS — J4520 Mild intermittent asthma, uncomplicated: Secondary | ICD-10-CM | POA: Diagnosis not present

## 2024-05-25 DIAGNOSIS — Z9889 Other specified postprocedural states: Secondary | ICD-10-CM | POA: Diagnosis not present

## 2024-05-25 DIAGNOSIS — F321 Major depressive disorder, single episode, moderate: Secondary | ICD-10-CM

## 2024-05-25 DIAGNOSIS — R600 Localized edema: Secondary | ICD-10-CM

## 2024-05-25 DIAGNOSIS — Z9109 Other allergy status, other than to drugs and biological substances: Secondary | ICD-10-CM

## 2024-05-25 DIAGNOSIS — I998 Other disorder of circulatory system: Secondary | ICD-10-CM | POA: Diagnosis not present

## 2024-05-25 DIAGNOSIS — F411 Generalized anxiety disorder: Secondary | ICD-10-CM | POA: Diagnosis not present

## 2024-05-25 MED ORDER — MUPIROCIN 2 % EX OINT: 1.0000 | TOPICAL_OINTMENT | Freq: Two times a day (BID) | CUTANEOUS | 0 refills | Status: DC
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
## 2024-06-03 ENCOUNTER — Ambulatory Visit: Payer: Self-pay | Admitting: Family Medicine

## 2024-06-03 LAB — SEDIMENTATION RATE: Sed Rate: 5 mm/h (ref 0–40)

## 2024-06-03 LAB — ANA COMPREHENSIVE PANEL
Anti JO-1: <0.2 AI (ref 0.0–0.9)
Centromere Ab Screen: <0.2 AI (ref 0.0–0.9)
Chromatin Ab SerPl-aCnc: <0.2 AI (ref 0.0–0.9)
ENA RNP Ab: 0.3 AI (ref 0.0–0.9)
ENA SM Ab Ser-aCnc: <0.2 AI (ref 0.0–0.9)
ENA SSA (RO) Ab: 0.2 AI (ref 0.0–0.9)
ENA SSB (LA) Ab: <0.2 AI (ref 0.0–0.9)
Scleroderma (Scl-70) (ENA) Antibody, IgG: <0.2 AI (ref 0.0–0.9)
dsDNA Ab: <1 [IU]/mL (ref 0–9)

## 2024-06-03 LAB — CMP14+EGFR
ALT: 18 IU/L (ref 0–32)
AST: 18 IU/L (ref 0–40)
Albumin: 4.3 g/dL (ref 3.8–4.9)
Alkaline Phosphatase: 48 IU/L (ref 44–121)
BUN/Creatinine Ratio: 27 — ABNORMAL HIGH (ref 9–23)
BUN: 23 mg/dL (ref 6–24)
Bilirubin Total: 0.2 mg/dL (ref 0.0–1.2)
CO2: 22 mmol/L (ref 20–29)
Calcium: 9.2 mg/dL (ref 8.7–10.2)
Chloride: 100 mmol/L (ref 96–106)
Creatinine, Ser: 0.86 mg/dL (ref 0.57–1.00)
Globulin, Total: 2.2 g/dL (ref 1.5–4.5)
Glucose: 85 mg/dL (ref 70–99)
Potassium: 4.3 mmol/L (ref 3.5–5.2)
Sodium: 141 mmol/L (ref 134–144)
Total Protein: 6.5 g/dL (ref 6.0–8.5)
eGFR: 80 mL/min/{1.73_m2} (ref >59)

## 2024-06-03 LAB — CBC WITH DIFFERENTIAL/PLATELET
Basophils Absolute: 0 10*3/uL (ref 0.0–0.2)
Basos: 0 %
EOS (ABSOLUTE): 0.1 10*3/uL (ref 0.0–0.4)
Eos: 2 %
Hematocrit: 41.6 % (ref 34.0–46.6)
Hemoglobin: 13.2 g/dL (ref 11.1–15.9)
Immature Grans (Abs): 0 10*3/uL (ref 0.0–0.1)
Immature Granulocytes: 0 %
Lymphocytes Absolute: 1.6 10*3/uL (ref 0.7–3.1)
Lymphs: 34 %
MCH: 29.1 pg (ref 26.6–33.0)
MCHC: 31.7 g/dL (ref 31.5–35.7)
MCV: 92 fL (ref 79–97)
Monocytes Absolute: 0.4 10*3/uL (ref 0.1–0.9)
Monocytes: 7 %
Neutrophils Absolute: 2.7 10*3/uL (ref 1.4–7.0)
Neutrophils: 57 %
Platelets: 165 10*3/uL (ref 150–450)
RBC: 4.53 x10E6/uL (ref 3.77–5.28)
RDW: 12.5 % (ref 11.7–15.4)
WBC: 4.8 10*3/uL (ref 3.4–10.8)

## 2024-06-03 LAB — RHEUMASSURE: 14.3.3 ETA, Rheum. Arthritis: <0.2 ng/mL

## 2024-06-03 LAB — C-REACTIVE PROTEIN: CRP: <1 mg/L (ref 0–10)

## 2024-06-03 NOTE — Progress Notes (Signed)
 Slightly elevated bun/creatinine ratio. Recommend adequate hydration of 80-100 oz of water daily. All other labs normal.

## 2024-07-23 ENCOUNTER — Ambulatory Visit

## 2024-07-23 VITALS — BP 107/68 | HR 58 | Ht 66.0 in | Wt 169.0 lb

## 2024-07-23 DIAGNOSIS — Z0000 Encounter for general adult medical examination without abnormal findings: Secondary | ICD-10-CM

## 2024-07-23 NOTE — Patient Instructions (Signed)
 Ms. Cacioppo , Thank you for taking time out of your busy schedule to complete your Annual Wellness Visit with me. I enjoyed our conversation and look forward to speaking with you again next year. I, as well as your care team,  appreciate your ongoing commitment to your health goals. Please review the following plan we discussed and let me know if I can assist you in the future. Your Game plan/ To Do List    Follow up Visits: Next Medicare AWV with our clinical staff: 07/26/25 at 9:20a.m.   Next Office Visit with your provider:   Clinician Recommendations:  Aim for 30 minutes of exercise or brisk walking, 6-8 glasses of water, and 5 servings of fruits and vegetables each day.       This is a list of the screening recommended for you and due dates:  Health Maintenance  Topic Date Due   Hepatitis B Vaccine (1 of 3 - 19+ 3-dose series) Never done   Zoster (Shingles) Vaccine (1 of 2) Never done   Pap with HPV screening  Never done   Pneumococcal Vaccination (1 of 2 - PCV) 02/25/2025*   DTaP/Tdap/Td vaccine (2 - Td or Tdap) 05/25/2025*   COVID-19 Vaccine (3 - Pfizer risk series) 08/08/2025*   Flu Shot  07/30/2024   Medicare Annual Wellness Visit  07/23/2025   Mammogram  03/08/2026   Colon Cancer Screening  11/30/2031   Hepatitis C Screening  Completed   HIV Screening  Completed   HPV Vaccine  Aged Out   Meningitis B Vaccine  Aged Out  *Topic was postponed. The date shown is not the original due date.    Advanced directives: (Declined) Advance directive discussed with you today. Even though you declined this today, please call our office should you change your mind, and we can give you the proper paperwork for you to fill out. Advance Care Planning is important because it:  [x]  Makes sure you receive the medical care that is consistent with your values, goals, and preferences  [x]  It provides guidance to your family and loved ones and reduces their decisional burden about whether or not  they are making the right decisions based on your wishes.  Follow the link provided in your after visit summary or read over the paperwork we have mailed to you to help you started getting your Advance Directives in place. If you need assistance in completing these, please reach out to us  so that we can help you!  See attachments for Preventive Care and Fall Prevention Tips.
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
## 2024-07-30 ENCOUNTER — Ambulatory Visit (INDEPENDENT_AMBULATORY_CARE_PROVIDER_SITE_OTHER): Admitting: Allergy & Immunology

## 2024-07-30 ENCOUNTER — Encounter: Payer: Self-pay | Admitting: Allergy & Immunology

## 2024-07-30 VITALS — BP 138/82 | Temp 97.4°F | Ht 67.0 in | Wt 167.5 lb

## 2024-07-30 DIAGNOSIS — K9049 Malabsorption due to intolerance, not elsewhere classified: Secondary | ICD-10-CM

## 2024-07-30 DIAGNOSIS — B999 Unspecified infectious disease: Secondary | ICD-10-CM

## 2024-07-30 DIAGNOSIS — J3089 Other allergic rhinitis: Secondary | ICD-10-CM

## 2024-07-30 DIAGNOSIS — J4520 Mild intermittent asthma, uncomplicated: Secondary | ICD-10-CM | POA: Diagnosis not present

## 2024-07-30 DIAGNOSIS — T783XXD Angioneurotic edema, subsequent encounter: Secondary | ICD-10-CM

## 2024-07-30 DIAGNOSIS — J302 Other seasonal allergic rhinitis: Secondary | ICD-10-CM | POA: Diagnosis not present

## 2024-07-30 DIAGNOSIS — T783XXA Angioneurotic edema, initial encounter: Secondary | ICD-10-CM

## 2024-07-30 NOTE — Patient Instructions (Addendum)
 1. Seasonal and perennial allergic rhinitis - We can repeat the environmental allergy testing at the next visit to see if there are any new allergies.  2. Angioedema - We are going to get some labs to look for hereditary angioedema.  - This might all be related to your vascular insufficiency, but we might as well rule out these severe swelling symptoms.  3. Food intolerance - Circle foods on the sheet that you are interested in testing. - There is a the low positive predictive value of food allergy testing and hence the high possibility of false positives. - In contrast, food allergy testing has a high negative predictive value, therefore if testing is negative we can be relatively assured that they are indeed negative.  - There is a high rate of false positive rate, so keep that in mind when we discuss the results.  4. Mild intermittent asthma, uncomplicated - Lung testing looks awesome today.  - We are not going to make any changes at this time. - Continue with albuterol  as needed.   5. Recurrent infections - We will obtain some screening labs to evaluate your immune system.  - Labs to evaluate the quantitative [facility]) aspects of your immune system: IgG/IgA/IgM, CBC with differential - Labs to evaluate the qualitative (HOW WELL THEY WORK) aspects of your immune system: CH50, Pneumococcal titers, Tetanus titers, Diphtheria titers - We may consider immunizations with Pneumovax and Tdap to challenge your immune system, and then obtain repeat titers in 4-6 weeks.   6. Return in about 2 weeks (around 08/13/2024) for ALLERGY TESTING (1-30 PEDS ENVIRO + SELECT FOODS). You can have the follow up appointment with Dr. Iva or a Nurse Practicioner (our Nurse Practitioners are excellent and always have Physician oversight!).    Please inform us  of any Emergency Department visits, hospitalizations, or changes in symptoms. Call us  before going to the ED for breathing or allergy symptoms since  we might be able to fit you in for a sick visit. Feel free to contact us  anytime with any questions, problems, or concerns.  It was a pleasure to see you and your family again today!  Websites that have reliable patient information: 1. American Academy of Asthma, Allergy, and Immunology: www.aaaai.org 2. Food Allergy Research and Education (FARE): foodallergy.org 3. Mothers of Asthmatics: http://www.asthmacommunitynetwork.org 4. American College of Allergy, Asthma, and Immunology: www.acaai.org      "Like" us  on Facebook and Instagram for our latest updates!      A healthy democracy works best when [employer] participate! Make sure you are registered to vote! If you have moved or changed any of your contact information, you will need to get this updated before voting! Scan the QR codes below to learn more!      Food Intolerance Versus Food Allergy  Food IntoleranceSome of the symptoms of food intolerance and food allergy are similar, but the differences between the two are very important. Eating a food you are intolerant to can leave you feeling miserable. However, if you have a true food allergy, your body's reaction to this food could be life-threatening.  Digestive system versus immune system  A food intolerance response takes place in the digestive system. It occurs when you are unable to properly breakdown the food. This could be due to enzyme deficiencies, sensitivity to food additives or reactions to naturally occurring chemicals in foods. Often, people can eat small amounts of the food without causing problems.  A food allergic reaction involves the immune  system. Your immune system controls how your body defends itself. For instance, if you have an allergy to cow's milk, your immune system identifies cow's milk as an invader or allergen. Your immune system overreacts by producing antibodies called Immunoglobulin E (IgE). These antibodies travel to cells that release chemicals,  causing an allergic reaction. Each type of IgE has a specific "radar" for each type of allergen.  Unlike an intolerance to food, a food allergy can cause a serious or even life-threatening reaction by eating a microscopic amount, touching or inhaling the food.  Symptoms of allergic reactions to foods are generally seen on the skin (hives, itchiness, swelling of the skin). Gastrointestinal symptoms may include vomiting and diarrhea. Respiratory symptoms may accompany skin and gastrointestinal symptoms, but don't usually occur alone.  Anaphylaxis (pronounced an-a-fi-LAK-sis) is a serious allergic reaction that happens very quickly. Symptoms of anaphylaxis may include difficulty breathing, dizziness or loss of consciousness. Without immediate treatment--an injection of epinephrine (adrenalin) and expert care--anaphylaxis can be fatal.  To the Point: there is a very serious difference between being intolerant to a food and having a food allergy.     Regarding Food IgG Testing: In IgG testing, the blood is tested for IgG antibodies instead of being tested for IgE antibodies (i.e., the antibodies typically associated with food allergies). The existence of serum IgG antibodies towards particular foods is claimed by many practitioners as a tool to diagnose food allergy or intolerance. The problem with this is that IgG is a "memory antibody." IgG signifies exposure to a food, not allergy to a food. Since a normal immune system should make IgG antibodies to foreign proteins, a positive IgG test to a food is a sign of a normal immune system. In fact, a positive result can actually indicate tolerance for the food, not intolerance. There is no scientific evidence to support IgG testing for the diagnosis of food allergies.

## 2024-07-30 NOTE — Progress Notes (Signed)
 NEW PATIENT  Date of Service/Encounter:  07/30/24  Consult requested by: Cathlene Marry Lenis, FNP (Inactive)   Assessment:   Seasonal and perennial allergic rhinitis  Angioedema - with an overlying history of chronic venous insufficiency  Food intolerance - with symptoms that are clearly not IgE-mediated  Mild intermittent asthma, uncomplicated - Plan: Spirometry with Graph  Recurrent infections   Plan/Recommendations:   1. Seasonal and perennial allergic rhinitis - We can repeat the environmental allergy testing at the next visit to see if there are any new allergies.  2. Angioedema - We are going to get some labs to look for hereditary angioedema.  - This might all be related to your vascular insufficiency, but we might as well rule out these severe swelling symptoms.  3. Food intolerance - Circle foods on the sheet that you are interested in testing. - There is a the low positive predictive value of food allergy testing and hence the high possibility of false positives. - In contrast, food allergy testing has a high negative predictive value, therefore if testing is negative we can be relatively assured that they are indeed negative.  - There is a high rate of false positive rate, so keep that in mind when we discuss the results.  4. Mild intermittent asthma, uncomplicated - Lung testing looks awesome today.  - We are not going to make any changes at this time. - Continue with albuterol  as needed.   5. Recurrent infections - We will obtain some screening labs to evaluate your immune system.  - Labs to evaluate the quantitative [facility]) aspects of your immune system: IgG/IgA/IgM, CBC with differential - Labs to evaluate the qualitative (HOW WELL THEY WORK) aspects of your immune system: CH50, Pneumococcal titers, Tetanus titers, Diphtheria titers - We may consider immunizations with Pneumovax and Tdap to challenge your immune system, and then obtain repeat  titers in 4-6 weeks.   6. Return in about 2 weeks (around 08/13/2024) for ALLERGY TESTING (1-30 PEDS ENVIRO + SELECT FOODS). You can have the follow up appointment with Dr. Iva or a Nurse Practicioner (our Nurse Practitioners are excellent and always have Physician oversight!).    This note in its entirety was forwarded to the Provider who requested this consultation.  Subjective:   [name] is a 53 y.o. female presenting today for evaluation of  Chief Complaint  Patient presents with   Asthma   Allergies    Foods    Allergic Rhinitis     LEKIA NIER has a history of the following: Patient Active Problem List   Diagnosis Date Noted   Vitamin D  deficiency 02/26/2024   PTSD (post-traumatic stress disorder)    OCD (obsessive compulsive disorder)    Attention deficit disorder 09/05/2017   Lumbar facet arthropathy 08/05/2017   Spasm of lumbar paraspinous muscle 06/11/2017   Lumbosacral disc herniation 06/02/2017   Spinal stenosis of lumbar region with neurogenic claudication 05/07/2017   Vascular insufficiency 05/07/2017   Plantar fasciitis of left foot 01/09/2016   Bursitis of left shoulder 09/11/2015   Other chronic pain 01/10/2015   Thyroid  nodule 11/29/2014   Pinched vertebral nerve 11/21/2014   Generalized anxiety disorder 09/08/2008   Hyperthyroidism 08/21/2008   Coccygeal pain 12/29/2007   Goiter 09/01/2007   Anemia, unspecified 09/01/2007   Major depressive disorder, single episode, unspecified 09/01/2007   Asthma 09/01/2007   Syncope 09/01/2007    History obtained from: chart review and patient.  Her mother always wanted to  name her [name]. But her father named her instead. This is why she is now called [name].   Discussed the use of AI scribe software for clinical note transcription with the patient and/or guardian, who gave verbal consent to proceed.  [name] was referred by Cathlene Marry Lenis, FNP (Inactive).     [name] is a 53  y.o. female presenting for an evaluation of food intolerances as well as environmental allergies and asthma.    Asthma/Respiratory Symptom History: She has issues when she has a cold. She has not used her albuterol  in ages.  She was never on a controller medication.  She does report getting prednisone  frequently throughout the year, although it is not clear whether this is for her breathing problems versus her chronic pain.  She has never been admitted to the hospital for asthma.  She denies any nighttime coughing or wheezing.  Allergic Rhinitis Symptom History: She occasionally uses Flonase.  She never was on allergy shots for her allergies.  She does not use any antihistamines.  She prefers not to use any medications at all. She had testing done at another practice in April 2020.  At that time, she was positive to trees, grasses, weeds, indoor and outdoor molds, dust mite, and cockroach.  She did end up following up a couple of times with that practice, but it never seem very fruitful.  She has had an small sinus CT in July 2024.  Food Allergy Symptom History: She reports a lot of what she calls food allergies.  However, she never had any issues with hives after eating a particular food.  She does note that green peppers consistently cause her to have generalized pain.  Pain seems to be in the #1 complaints from her regarding her concern for food allergies.  She tolerates all the major food allergens without a problem.  She does not really have much in the way of GI issues.  She never had food testing done at the last allergy office because she never really posted for it.  She has never had an EpiPen.  She also has a history of swelling.  However, she also has vascular insufficiency.  Ultrasounds of her lower extremities have been normal.  She currently sees Donnice Ros, [place] with vascular surgery.  Skin Symptom History: She notes send to get poison ivy fairly frequently.  This does require rounds of  prednisone  to clear up.  GERD Symptom History: She has had a colonoscopy, most recently in 2022.  At that time, she had 1 benign polyp that was removed.  She has a history of hemorrhoids and have these banded.  She sees Dr. Leigh with gastroenterology.  Infection Symptom History: She does report having had 3-5 courses of antibiotics since October 2024.  She has had all of her issues started in October with a UTI and then she developed bronchitis, sinusitis, and pneumonia.  She finally started feeling better around the beginning of March.  It does not look like she has ever had an immune workup at all.  She does have a history of chronic pain.  She reports that she was on gabapentin, but had an allergic reaction to this.  She does get prednisone  for her pain.  This is all managed by her primary care provider.  She is on disability for her pain, but before that she worked at [employer].  She has a history of anxiety and depression.  She currently does a Database administrator once  a week, which seems to be working well.  She is not currently on any psychiatric medications at all.  She had a head CT in March 2021 following a concussion.  Otherwise, there is no history of other atopic diseases, including drug allergies, stinging insect allergies, or contact dermatitis. There is no significant infectious history. Vaccinations are up to date.    Past Medical History: Patient Active Problem List   Diagnosis Date Noted   Vitamin D  deficiency 02/26/2024   PTSD (post-traumatic stress disorder)    OCD (obsessive compulsive disorder)    Attention deficit disorder 09/05/2017   Lumbar facet arthropathy 08/05/2017   Spasm of lumbar paraspinous muscle 06/11/2017   Lumbosacral disc herniation 06/02/2017   Spinal stenosis of lumbar region with neurogenic claudication 05/07/2017   Vascular insufficiency 05/07/2017   Plantar fasciitis of left foot 01/09/2016   Bursitis of left shoulder 09/11/2015    Other chronic pain 01/10/2015   Thyroid  nodule 11/29/2014   Pinched vertebral nerve 11/21/2014   Generalized anxiety disorder 09/08/2008   Hyperthyroidism 08/21/2008   Coccygeal pain 12/29/2007   Goiter 09/01/2007   Anemia, unspecified 09/01/2007   Major depressive disorder, single episode, unspecified 09/01/2007   Asthma 09/01/2007   Syncope 09/01/2007    Medication List:  Allergies as of 07/30/2024       Reactions   Rocephin [ceftriaxone] Anaphylaxis   Ceftriaxone Sodium    REACTION: unspecified   Gabapentin Swelling        Medication List        Accurate as of July 30, 2024  9:26 AM. If you have any questions, ask your nurse or doctor.          albuterol  108 (90 Base) MCG/ACT inhaler Commonly known as: VENTOLIN  HFA   CALCIUM -VITAMIN D  PO Take 1 tablet by mouth daily.   mupirocin  ointment 2 % Commonly known as: BACTROBAN  Apply 1 Application topically 2 (two) times daily.        Birth History: non-contributory  Developmental History: non-contributory  Past Surgical History: Past Surgical History:  Procedure Laterality Date   ABDOMINAL HYSTERECTOMY     BUNIONECTOMY Bilateral 2021   CHOLECYSTECTOMY     CRYOTHERAPY     cervix   TUBAL LIGATION     WISDOM TOOTH EXTRACTION       Family History: Family History  Problem Relation Age of Onset   Asthma Mother    Colon polyps Mother    Bipolar disorder Mother    Depression Mother    Schizophrenia Father    Drug abuse Father    Anxiety disorder Sister    Anxiety disorder Brother    Physical abuse Brother    Pancreatic cancer Maternal Uncle        was in transplanted pancreas   Arthritis Other    Diabetes Other    ADD / ADHD Other    Heart disease Other        rheumatic heart disease   OCD Other    Anxiety disorder Other    Allergic rhinitis Son    Urticaria Son    Eczema Son    Asthma Son    Eczema Son    Asthma Son    Colon cancer Neg Hx    Stomach cancer Neg Hx    Rectal cancer Neg  Hx    Esophageal cancer Neg Hx    Breast cancer Neg Hx      Social History: Avilene lives at home with her husband.  They  have been together for 40 years.  They live in a house that is 55 years old.  There is laminate throughout the home.  They have a heat pump for heating and cooling.  There are cats and dogs inside the home.  There are dust mite covers on the bed, but not the pillows.  There is no tobacco exposure.  There is no fume, chemical, or dust exposure.  They do not live near an interstate or industrial area.   Review of systems otherwise negative other than that mentioned in the HPI.    Objective:   Blood pressure 138/82, temperature (!) 97.4 F (36.3 C), height 5' 7 (1.702 m), weight 167 lb 8 oz (76 kg). Body mass index is 26.23 kg/m.     Physical Exam Vitals reviewed.  Constitutional:      Appearance: She is well-developed.  HENT:     Head: Normocephalic and atraumatic.     Right Ear: Tympanic membrane, ear canal and external ear normal. No drainage, swelling or tenderness. Tympanic membrane is not injected, scarred, erythematous, retracted or bulging.     Left Ear: Tympanic membrane, ear canal and external ear normal. No drainage, swelling or tenderness. Tympanic membrane is not injected, scarred, erythematous, retracted or bulging.     Nose: No nasal deformity, septal deviation, mucosal edema or rhinorrhea.     Right Turbinates: Enlarged, swollen and pale.     Left Turbinates: Enlarged, swollen and pale.     Right Sinus: No maxillary sinus tenderness or frontal sinus tenderness.     Left Sinus: No maxillary sinus tenderness or frontal sinus tenderness.     Mouth/Throat:     Lips: Pink.     Mouth: Mucous membranes are moist. Mucous membranes are not pale and not dry.     Pharynx: Uvula midline.  Eyes:     General: Lids are normal. Allergic shiner present.        Right eye: No discharge.        Left eye: No discharge.     Conjunctiva/sclera: Conjunctivae  normal.     Right eye: Right conjunctiva is not injected. No chemosis.    Left eye: Left conjunctiva is not injected. No chemosis.    Pupils: Pupils are equal, round, and reactive to light.  Cardiovascular:     Rate and Rhythm: Normal rate and regular rhythm.     Heart sounds: Normal heart sounds.  Pulmonary:     Effort: Pulmonary effort is normal. No tachypnea, accessory muscle usage or respiratory distress.     Breath sounds: Normal breath sounds. No wheezing, rhonchi or rales.     Comments: Moving air well in all lung fields.  Chest:     Chest wall: No tenderness.  Abdominal:     Tenderness: There is no abdominal tenderness. There is no guarding or rebound.  Lymphadenopathy:     Head:     Right side of head: No submandibular, tonsillar or occipital adenopathy.     Left side of head: No submandibular, tonsillar or occipital adenopathy.     Cervical: No cervical adenopathy.  Skin:    General: Skin is warm.     Capillary Refill: Capillary refill takes less than 2 seconds.     Coloration: Skin is not pale.     Findings: No abrasion, erythema, petechiae or rash. Rash is not papular, urticarial or vesicular.     Comments: Skin looks good overall.   Neurological:     Mental Status: She  is alert.  Psychiatric:        Behavior: Behavior is cooperative.      Diagnostic studies:    Spirometry: results normal (FEV1: 2.43/84%, FVC: 3.10/84%, FEV1/FVC: 78%).    Spirometry consistent with normal pattern.   Allergy Studies: deferred due to insurance stipulations that require a separate visit for testing          Marty Shaggy, MD [facility] 

## 2024-08-04 DIAGNOSIS — T783XXA Angioneurotic edema, initial encounter: Secondary | ICD-10-CM | POA: Diagnosis not present

## 2024-08-04 DIAGNOSIS — B999 Unspecified infectious disease: Secondary | ICD-10-CM | POA: Diagnosis not present

## 2024-08-10 ENCOUNTER — Ambulatory Visit: Payer: Self-pay | Admitting: Allergy & Immunology

## 2024-08-11 LAB — C1 ESTERASE INHIBITOR: C1INH SerPl-mCnc: 29 mg/dL (ref 21–39)

## 2024-08-11 LAB — C1 ESTERASE INHIBITOR, FUNCTIONAL: C1INH Functional/C1INH Total MFr SerPl: >105 %{normal}

## 2024-08-11 LAB — COMPLEMENT COMPONENT C1Q: Complement C1Q: 16 mg/dL (ref 10.3–20.5)

## 2024-08-11 LAB — C3 AND C4
Complement C3, Serum: 104 mg/dL (ref 82–167)
Complement C4, Serum: 17 mg/dL (ref 12–38)

## 2024-08-11 LAB — TRYPTASE: Tryptase: 3.9 ug/L (ref 2.2–13.2)

## 2024-08-13 LAB — STREP PNEUMONIAE 23 SEROTYPES IGG
Pneumo Ab Type 1*: <0.1 ug/mL — ABNORMAL LOW (ref >1.3)
Pneumo Ab Type 12 (12F)*: 0.1 ug/mL — ABNORMAL LOW (ref >1.3)
Pneumo Ab Type 14*: 0.4 ug/mL — ABNORMAL LOW (ref >1.3)
Pneumo Ab Type 17 (17F)*: 9.9 ug/mL (ref >1.3)
Pneumo Ab Type 19 (19F)*: 0.8 ug/mL — ABNORMAL LOW (ref >1.3)
Pneumo Ab Type 2*: 0.5 ug/mL — ABNORMAL LOW (ref >1.3)
Pneumo Ab Type 20*: 0.5 ug/mL — ABNORMAL LOW (ref >1.3)
Pneumo Ab Type 22 (22F)*: 0.6 ug/mL — ABNORMAL LOW (ref >1.3)
Pneumo Ab Type 23 (23F)*: 0.2 ug/mL — ABNORMAL LOW (ref >1.3)
Pneumo Ab Type 26 (6B)*: <0.1 ug/mL — ABNORMAL LOW (ref >1.3)
Pneumo Ab Type 3*: 0.2 ug/mL — ABNORMAL LOW (ref >1.3)
Pneumo Ab Type 34 (10A)*: 6.3 ug/mL (ref >1.3)
Pneumo Ab Type 4*: 0.3 ug/mL — ABNORMAL LOW (ref >1.3)
Pneumo Ab Type 43 (11A)*: 0.3 ug/mL — ABNORMAL LOW (ref >1.3)
Pneumo Ab Type 5*: <0.1 ug/mL — ABNORMAL LOW (ref >1.3)
Pneumo Ab Type 51 (7F)*: 1.7 ug/mL (ref >1.3)
Pneumo Ab Type 54 (15B)*: 0.6 ug/mL — ABNORMAL LOW (ref >1.3)
Pneumo Ab Type 56 (18C)*: 0.9 ug/mL — ABNORMAL LOW (ref >1.3)
Pneumo Ab Type 57 (19A)*: 3.9 ug/mL (ref >1.3)
Pneumo Ab Type 68 (9V)*: 1.5 ug/mL (ref >1.3)
Pneumo Ab Type 70 (33F)*: 1 ug/mL — ABNORMAL LOW (ref >1.3)
Pneumo Ab Type 8*: 5.4 ug/mL (ref >1.3)
Pneumo Ab Type 9 (9N)*: 0.2 ug/mL — ABNORMAL LOW (ref >1.3)

## 2024-08-13 LAB — DIPHTHERIA / TETANUS ANTIBODY PANEL
Diphtheria Ab: 0.74 [IU]/mL (ref <0.10)
Tetanus Ab, IgG: 1.07 [IU]/mL (ref <0.10)

## 2024-08-13 LAB — CBC WITH DIFF/PLATELET
Basophils Absolute: 0 x10E3/uL (ref 0.0–0.2)
Basos: 1 %
EOS (ABSOLUTE): 0.2 x10E3/uL (ref 0.0–0.4)
Eos: 3 %
Hematocrit: 39.8 % (ref 34.0–46.6)
Hemoglobin: 12.7 g/dL (ref 11.1–15.9)
Immature Grans (Abs): 0 x10E3/uL (ref 0.0–0.1)
Immature Granulocytes: 0 %
Lymphocytes Absolute: 1.9 x10E3/uL (ref 0.7–3.1)
Lymphs: 40 %
MCH: 30 pg (ref 26.6–33.0)
MCHC: 31.9 g/dL (ref 31.5–35.7)
MCV: 94 fL (ref 79–97)
Monocytes Absolute: 0.3 x10E3/uL (ref 0.1–0.9)
Monocytes: 6 %
Neutrophils Absolute: 2.4 x10E3/uL (ref 1.4–7.0)
Neutrophils: 50 %
Platelets: 162 x10E3/uL (ref 150–450)
RBC: 4.23 x10E6/uL (ref 3.77–5.28)
RDW: 12.8 % (ref 11.7–15.4)
WBC: 4.8 x10E3/uL (ref 3.4–10.8)

## 2024-08-13 LAB — IGG, IGA, IGM
IgA/Immunoglobulin A, Serum: 128 mg/dL (ref 87–352)
IgG (Immunoglobin G), Serum: 898 mg/dL (ref 586–1602)
IgM (Immunoglobulin M), Srm: 44 mg/dL (ref 26–217)

## 2024-08-13 LAB — COMPLEMENT, TOTAL: Compl, Total (CH50): 58 U/mL (ref >41)

## 2024-08-18 ENCOUNTER — Encounter: Payer: Self-pay | Admitting: Allergy & Immunology

## 2024-08-18 ENCOUNTER — Ambulatory Visit: Admitting: Allergy & Immunology

## 2024-08-18 ENCOUNTER — Ambulatory Visit (INDEPENDENT_AMBULATORY_CARE_PROVIDER_SITE_OTHER): Admitting: Allergy & Immunology

## 2024-08-18 DIAGNOSIS — J302 Other seasonal allergic rhinitis: Secondary | ICD-10-CM | POA: Diagnosis not present

## 2024-08-18 DIAGNOSIS — T783XXD Angioneurotic edema, subsequent encounter: Secondary | ICD-10-CM | POA: Diagnosis not present

## 2024-08-18 DIAGNOSIS — B999 Unspecified infectious disease: Secondary | ICD-10-CM | POA: Diagnosis not present

## 2024-08-18 DIAGNOSIS — J3089 Other allergic rhinitis: Secondary | ICD-10-CM | POA: Diagnosis not present

## 2024-08-18 NOTE — Addendum Note (Signed)
 Addended by: NANCEE JON SAILOR on: 08/18/2024 04:45 PM   Modules accepted: Orders

## 2024-08-18 NOTE — Progress Notes (Signed)
 FOLLOW UP  Date of Service/Encounter:  08/18/24   Assessment:   Seasonal and perennial allergic rhinitis   Angioedema - with an overlying history of chronic venous insufficiency   Food intolerance - with symptoms that are clearly not IgE-mediated, confirmed with negative skin testing today   Mild intermittent asthma, uncomplicated    Recurrent infections - needs Pneumovax and repeat streptococcal titers   Plan/Recommendations:   1. Seasonal and perennial allergic rhinitis - Testing was positive to grasses as well as trees. - Copy testing results provided.  2. Angioedema - Thankfully everything was normal. - Continue  to no triggers.  3. Food intolerance - Testing was negative to all foods tested. - Co testing results provided.py  - There is a the low positive predictive value of food allergy  testing and hence the high possibility of false positives. - In contrast, food allergy  testing has a high negative predictive value, therefore if testing is negative we can be relatively assured that they are indeed negative.  - There is a high rate of false positive rate, so keep that in mind when we discuss the results. - I  do not think there is any need to avoid any food from an allergy  perspective, but you might still have an intolerance so you can certainly avoid it if you are interested.  4. Mild intermittent asthma, uncomplicated - Lung testing looks awesome at the last visit.  - We are not going to make any changes at this time. - Continue with albuterol  as needed.   5. Recurrent infections - You need a Pneumovax and repeat titers in 4 to 6 weeks.  6. Return in about 3 months (around 11/18/2024). You can have the follow up appointment with Dr. Iva or a Nurse Practicioner (our Nurse Practitioners are excellent and always have Physician oversight!).    Subjective:   [name] is a 53 y.o. female presenting today for follow up of No chief complaint on  file.   [name] has a history of the following: Patient Active Problem List   Diagnosis Date Noted   Vitamin D  deficiency 02/26/2024   PTSD (post-traumatic stress disorder)    OCD (obsessive compulsive disorder)    Attention deficit disorder 09/05/2017   Lumbar facet arthropathy 08/05/2017   Spasm of lumbar paraspinous muscle 06/11/2017   Lumbosacral disc herniation 06/02/2017   Spinal stenosis of lumbar region with neurogenic claudication 05/07/2017   Vascular insufficiency 05/07/2017   Plantar fasciitis of left foot 01/09/2016   Bursitis of left shoulder 09/11/2015   Other chronic pain 01/10/2015   Thyroid  nodule 11/29/2014   Pinched vertebral nerve 11/21/2014   Generalized anxiety disorder 09/08/2008   Hyperthyroidism 08/21/2008   Coccygeal pain 12/29/2007   Goiter 09/01/2007   Anemia, unspecified 09/01/2007   Major depressive disorder, single episode, unspecified 09/01/2007   Asthma 09/01/2007   Syncope 09/01/2007    History obtained from: chart review and patient.  Discussed the use of AI scribe software for clinical note transcription with the patient and/or guardian, who gave verbal consent to proceed.  [name] is a 52 y.o. female presenting for skin testing. She was last seen on August 1. We could not do testing because her insurance company does not cover testing on the same day as a New Patient visit. She has been off of all antihistamines 3 days in anticipation of the testing.    At that time, we obtained labs to look for hereditary angioedema. For her food intolerance,  we gave her a food sheet to circle the foods that she was interested in getting tested.  Like testing was great.  Will continue with albuterol  as needed.  For her recurrent infections, we obtain labs to look at her immune system.  Her labs were notable for inadequate protection against Streptococcus pneumonia.  We recommended a Pneumovax or Prevnar with repeat titers in 4 to 6 weeks.   Hereditary angioedema labs were normal.  She experiences persistent nasal pain throughout the year, which she suspects may be related to environmental factors. Her symptoms improved with adherence to a strict diet plan. However, she has noticed an increase in symptoms recently, though the exact cause is unclear.  She denies any food allergies but acknowledges the possibility of food intolerances. She has not required an EpiPen for her symptoms.  During the review of symptoms, she notes having bruises on her body, which she was unaware of until pointed out. No known food allergies.  Otherwise, there have been no changes to her past medical history, surgical history, family history, or social history.    Review of systems otherwise negative other than that mentioned in the HPI.    Objective:   There were no vitals taken for this visit. There is no height or weight on file to calculate BMI.    Physical exam deferred since this was a skin testing appointment only.   Diagnostic studies:    Allergy  Studies:     Pediatric Percutaneous Testing - 08/18/24 1051     Time Antigen Placed 1030    Allergen Manufacturer Jestine    Location Back    Number of Test 30    Pediatric Panel Airborne    1. Control-Buffer 50% Glycerol Negative    2. Control-Histamine 2+    3. [place]ia 3+    4. [place] Negative    5. Johnson 2+    6. Grass Mix, 7 2+    7. Ragweed Mix Negative    8. Plantain, English Negative    9. Lamb's Quarters Negative    10. Sheep Sorrell Negative    11. Mugwort, Common Negative    12. Box Elder Negative    13. Cedar, Red Negative    14. Walnut, Black Pollen 3+    15. Red Mullberry 2+    16. Ash Mix Negative    17. Birch Mix 4+    18. Cottonwood, Eastern 2+    19. Hickory, White 3+    20.SABRA Hay, Eastern Mix 2+    21. Sycamore, Eastern Negative    22. Alternaria Alternata Negative    23. Cladosporium Herbarum Negative    24. Aspergillus Mix Negative    25. Penicillium  Mix Negative    26. Dust Mite Mix Negative    27. Cat Hair 10,000 BAU/ml Negative    28. Dog Epithelia Negative    29. Mixed Feathers Negative    30. Cockroach, [place] Negative          Food Adult Perc - 08/18/24 1000     Time Antigen Placed --   1030   Allergen Manufacturer Jestine    Location Back    Number of allergen test 39     Control-buffer 50% Glycerol Negative    Control-Histamine 2+    1. Peanut Negative    3. Wheat Negative    5. Milk, Cow Negative    6. Casein Negative    7. Egg White, Chicken Negative    8. Shellfish Mix  Negative    10. Cashew Negative    11. Walnut Food Negative    12. Almond Negative    14. Pecan Food Negative    17. Coconut Negative    19. Tuna Negative    20. Salmon Negative    23. Shrimp Negative    24. Crab Negative    28. Oat  Negative    34. Chicken Meat Negative    36. Beef Negative    38. Tomato Negative    40. Sweet Potato Negative    43. Green Beans Negative    45. Green Pepper Negative    46. Mushrooms Negative    48. Avocado Negative    50. Carrots Negative    51. Celery Negative    52. Corn Negative    55. Orange  Negative    56. Lemon Negative    57. Banana Negative    58. Apple Negative    60. Strawberry Negative    61. Blueberry Negative    64. Watermelon Negative    65. Pineapple Negative    66. Chocolate/Cacao Bean Negative    67. Cinnamon Negative    69. Ginger Negative    71. Pepper, Black Negative          Allergy  testing results were read and interpreted by myself, documented by clinical staff.      Marty Shaggy, MD  [facility] 

## 2024-08-18 NOTE — Patient Instructions (Addendum)
 1. Seasonal and perennial allergic rhinitis - Testing was positive to grasses as well as trees. - Copy testing results provided.  2. Angioedema - Thankfully everything was normal. - Continue  to no triggers.  3. Food intolerance - Testing was negative to all foods tested. - Co testing results provided.py  - There is a the low positive predictive value of food allergy  testing and hence the high possibility of false positives. - In contrast, food allergy  testing has a high negative predictive value, therefore if testing is negative we can be relatively assured that they are indeed negative.  - There is a high rate of false positive rate, so keep that in mind when we discuss the results. - I  do not think there is any need to avoid any food from an allergy  perspective, but you might still have an intolerance so you can certainly avoid it if you are interested.  4. Mild intermittent asthma, uncomplicated - Lung testing looks awesome at the last visit.  - We are not going to make any changes at this time. - Continue with albuterol  as needed.   5. Recurrent infections - You need a Pneumovax and repeat titers in 4 to 6 weeks.  6. Return in about 3 months (around 11/18/2024). You can have the follow up appointment with Dr. Iva or a Nurse Practicioner (our Nurse Practitioners are excellent and always have Physician oversight!).    Please inform us  of any Emergency Department visits, hospitalizations, or changes in symptoms. Call us  before going to the ED for breathing or allergy  symptoms since we might be able to fit you in for a sick visit. Feel free to contact us  anytime with any questions, problems, or concerns.  It was a pleasure to see you and your family again today!  Websites that have reliable patient information: 1. American Academy of Asthma, Allergy , and Immunology: www.aaaai.org 2. Food Allergy  Research and Education (FARE): foodallergy.org 3. Mothers of Asthmatics:  http://www.asthmacommunitynetwork.org 4. American College of Allergy , Asthma, and Immunology: www.acaai.org      "Like" us  on Facebook and Instagram for our latest updates!      A healthy democracy works best when [employer] participate! Make sure you are registered to vote! If you have moved or changed any of your contact information, you will need to get this updated before voting! Scan the QR codes below to learn more!      Food Intolerance Versus Food Allergy   Food IntoleranceSome of the symptoms of food intolerance and food allergy  are similar, but the differences between the two are very important. Eating a food you are intolerant to can leave you feeling miserable. However, if you have a true food allergy , your body's reaction to this food could be life-threatening.  Digestive system versus immune system  A food intolerance response takes place in the digestive system. It occurs when you are unable to properly breakdown the food. This could be due to enzyme deficiencies, sensitivity to food additives or reactions to naturally occurring chemicals in foods. Often, people can eat small amounts of the food without causing problems.  A food allergic reaction involves the immune system. Your immune system controls how your body defends itself. For instance, if you have an allergy  to cow's milk, your immune system identifies cow's milk as an invader or allergen. Your immune system overreacts by producing antibodies called Immunoglobulin E (IgE). These antibodies travel to cells that release chemicals, causing an allergic reaction. Each type of IgE has a  specific "radar" for each type of allergen.  Unlike an intolerance to food, a food allergy  can cause a serious or even life-threatening reaction by eating a microscopic amount, touching or inhaling the food.  Symptoms of allergic reactions to foods are generally seen on the skin (hives, itchiness, swelling of the skin). Gastrointestinal  symptoms may include vomiting and diarrhea. Respiratory symptoms may accompany skin and gastrointestinal symptoms, but don't usually occur alone.  Anaphylaxis (pronounced an-a-fi-LAK-sis) is a serious allergic reaction that happens very quickly. Symptoms of anaphylaxis may include difficulty breathing, dizziness or loss of consciousness. Without immediate treatment--an injection of epinephrine (adrenalin) and expert care--anaphylaxis can be fatal.  To the Point: there is a very serious difference between being intolerant to a food and having a food allergy .     Regarding Food IgG Testing: In IgG testing, the blood is tested for IgG antibodies instead of being tested for IgE antibodies (i.e., the antibodies typically associated with food allergies). The existence of serum IgG antibodies towards particular foods is claimed by many practitioners as a tool to diagnose food allergy  or intolerance. The problem with this is that IgG is a "memory antibody." IgG signifies exposure to a food, not allergy  to a food. Since a normal immune system should make IgG antibodies to foreign proteins, a positive IgG test to a food is a sign of a normal immune system. In fact, a positive result can actually indicate tolerance for the food, not intolerance. There is no scientific evidence to support IgG testing for the diagnosis of food allergies.      Pediatric Percutaneous Testing - 08/18/24 1051     Time Antigen Placed 1030    Allergen Manufacturer Jestine    Location Back    Number of Test 30    Pediatric Panel Airborne    1. Control-Buffer 50% Glycerol Negative    2. Control-Histamine 2+    3. [place] 3+    4. [place] Negative    5. Johnson 2+    6. Grass Mix, 7 2+    7. Ragweed Mix Negative    8. Plantain, English Negative    9. Lamb's Quarters Negative    10. Sheep Sorrell Negative    11. Mugwort, Common Negative    12. Box Elder Negative    13. Cedar, Red Negative    14. Walnut, Black Pollen 3+     15. Red Mullberry 2+    16. Ash Mix Negative    17. Birch Mix 4+    18. Cottonwood, Eastern 2+    19. Hickory, White 3+    20.SABRA Hay, Eastern Mix 2+    21. Sycamore, Eastern Negative    22. Alternaria Alternata Negative    23. Cladosporium Herbarum Negative    24. Aspergillus Mix Negative    25. Penicillium Mix Negative    26. Dust Mite Mix Negative    27. Cat Hair 10,000 BAU/ml Negative    28. Dog Epithelia Negative    29. Mixed Feathers Negative    30. Cockroach, [place] Negative          Food Adult Perc - 08/18/24 1000     Time Antigen Placed --   1030   Allergen Manufacturer Jestine    Location Back    Number of allergen test 39     Control-buffer 50% Glycerol Negative    Control-Histamine 2+    1. Peanut Negative    3. Wheat Negative    5. Milk, Cow Negative  6. Casein Negative    7. Egg White, Chicken Negative    8. Shellfish Mix Negative    10. Cashew Negative    11. Walnut Food Negative    12. Almond Negative    14. Pecan Food Negative    17. Coconut Negative    19. Tuna Negative    20. Salmon Negative    23. Shrimp Negative    24. Crab Negative    28. Oat  Negative    34. Chicken Meat Negative    36. Beef Negative    38. Tomato Negative    40. Sweet Potato Negative    43. Green Beans Negative    45. Green Pepper Negative    46. Mushrooms Negative    48. Avocado Negative    50. Carrots Negative    51. Celery Negative    52. Corn Negative    55. Orange  Negative    56. Lemon Negative    57. Banana Negative    58. Apple Negative    60. Strawberry Negative    61. Blueberry Negative    64. Watermelon Negative    65. Pineapple Negative    66. Chocolate/Cacao Bean Negative    67. Cinnamon Negative    69. Ginger Negative    71. Pepper, Black Negative          Reducing Pollen Exposure  The American Academy of Allergy , Asthma and Immunology suggests the following steps to reduce your exposure to pollen during allergy  seasons.    Do not hang  sheets or clothing out to dry; pollen may collect on these items. Do not mow lawns or spend time around freshly cut grass; mowing stirs up pollen. Keep windows closed at night.  Keep car windows closed while driving. Minimize morning activities outdoors, a time when pollen counts are usually at their highest. Stay indoors as much as possible when pollen counts or humidity is high and on windy days when pollen tends to remain in the air longer. Use air conditioning when possible.  Many air conditioners have filters that trap the pollen spores. Use a HEPA room air filter to remove pollen form the indoor air you breathe.

## 2024-08-19 ENCOUNTER — Ambulatory Visit (INDEPENDENT_AMBULATORY_CARE_PROVIDER_SITE_OTHER): Admitting: Family Medicine

## 2024-08-19 VITALS — BP 111/64 | HR 62 | Ht 67.0 in | Wt 169.0 lb

## 2024-08-19 DIAGNOSIS — M7631 Iliotibial band syndrome, right leg: Secondary | ICD-10-CM | POA: Diagnosis not present

## 2024-08-19 NOTE — Progress Notes (Signed)
 BP 111/64   Pulse 62   Ht 5' 7 (1.702 m)   Wt 169 lb (76.7 kg)   SpO2 98%   BMI 26.47 kg/m    Subjective:   Patient ID: [name], female    DOB: 20-Mar-1971, 53 y.o.   MRN: [id number]  HPI: [name] is a 53 y.o. female presenting on 08/19/2024 for Leg Pain (right)   Discussed the use of AI scribe software for clinical note transcription with the patient, who gave verbal consent to proceed.  History of Present Illness   [name] is a 53 year old female who presents with right leg pain and swelling.  She has experienced significant swelling in her right leg since 2018, which has recently been accompanied by severe pain. The pain began in her knee and behind her knee approximately two weeks ago and has since progressed to involve her entire leg from the hip down to the ankle. She describes the pain as constant and 'really bad hurting'.  An ultrasound of the right leg was performed by a vascular doctor within the last two months due to noticeable swelling compared to her other leg. The pain has persisted since the ultrasound.  The pain starts at the ankle and shoots upwards, with significant discomfort in the hip and the outside of the knee. Her leg feels 'extremely heavy', and certain movements exacerbate the pain, particularly in the hip and knee areas.  She has a history of swelling in both legs and has not been able to maintain her previous level of physical activity due to the pain. No significant back pain currently, although she mentions a longstanding issue with a 'bad groove' in her back.  She does not report any relief from the pain with any specific measures and has not been taking any medications for it. A recent allergy  test increased her overall body pain.  She lives in [place] and is involved with a local school, indicating a connection to the community.       Relevant past medical, surgical, family and social history reviewed and updated  as indicated. Interim medical history since our last visit reviewed. Allergies and medications reviewed and updated.  Review of Systems  Constitutional:  Negative for chills and fever.  Eyes:  Negative for visual disturbance.  Respiratory:  Negative for chest tightness and shortness of breath.   Cardiovascular:  Positive for leg swelling. Negative for chest pain.  Musculoskeletal:  Positive for arthralgias and joint swelling. Negative for back pain and gait problem.  Skin:  Negative for rash.  Neurological:  Negative for light-headedness and headaches.  Psychiatric/Behavioral:  Negative for agitation and behavioral problems.   All other systems reviewed and are negative.   Per HPI unless specifically indicated above   Allergies as of 08/19/2024       Reactions   Rocephin [ceftriaxone] Anaphylaxis   Ceftriaxone Sodium    REACTION: unspecified   Gabapentin Swelling        Medication List        Accurate as of August 19, 2024  2:26 PM. If you have any questions, ask your nurse or doctor.          albuterol  108 (90 Base) MCG/ACT inhaler Commonly known as: VENTOLIN  HFA   CALCIUM -VITAMIN D  PO Take 1 tablet by mouth daily.   mupirocin  ointment 2 % Commonly known as: BACTROBAN  Apply 1 Application topically 2 (two) times daily.  Objective:   BP 111/64   Pulse 62   Ht 5' 7 (1.702 m)   Wt 169 lb (76.7 kg)   SpO2 98%   BMI 26.47 kg/m   Wt Readings from Last 3 Encounters:  08/19/24 169 lb (76.7 kg)  07/30/24 167 lb 8 oz (76 kg)  07/23/24 169 lb (76.7 kg)    Physical Exam Vitals and nursing note reviewed.    Physical Exam   EXTREMITIES: No tenderness on palpation of right leg. MUSCULOSKELETAL: Tenderness in right hip. Pain in right hip on leg lift. Pain on the outside of right hip on manipulation. Tenderness on the outside of right knee. Pain in right knee and ankle on manipulation.         Assessment & Plan:   Problem List Items Addressed  This Visit   None Visit Diagnoses       It band syndrome, right    -  Primary   Relevant Orders   Ambulatory referral to Physical Therapy         Right lower extremity pain and swelling due to iliotibial band syndrome Significant pain and swelling in the right lower extremity, primarily affecting the hip, knee, and ankle. Suspected iliotibial band syndrome characterized by inflammation and tightness of the IT band. - Refer to physical therapy for management of IT band syndrome. - Advise use of a rolling pin or frozen water bottle to massage the IT band from hip to knee. - Instruct on daily stretching exercises to alleviate tightness in the IT band. - Advise to return if symptoms do not improve with physical therapy.  Bilateral lower extremity edema Reports swelling in both legs, with the right leg being more affected. Edema present for several years, but pain and swelling have recently worsened.          Follow up plan: Return if symptoms worsen or fail to improve.  Counseling provided for all of the vaccine components Orders Placed This Encounter  Procedures   Ambulatory referral to Physical Therapy    Fonda Levins, MD [facility] 08/19/2024, 2:26 PM

## 2024-08-20 NOTE — Addendum Note (Signed)
 Addended by: Edi Gorniak E on: 08/20/2024 11:44 AM   Modules accepted: Orders

## 2024-10-11 ENCOUNTER — Ambulatory Visit: Admitting: Physical Therapy

## 2024-10-11 NOTE — Therapy (Signed)
 Patient arrives with a CC of bilateral LE swelling and reports a h/o venous insufficiency.  She also states she has a family h/o lymphedema and would like to be evaluated in consideration of this.

## 2024-10-21 ENCOUNTER — Ambulatory Visit: Admitting: Family Medicine

## 2024-10-21 ENCOUNTER — Encounter: Payer: Self-pay | Admitting: Family Medicine

## 2024-10-21 VITALS — BP 102/63 | HR 69 | Temp 98.6°F | Ht 67.0 in | Wt 171.6 lb

## 2024-10-21 DIAGNOSIS — J0100 Acute maxillary sinusitis, unspecified: Secondary | ICD-10-CM

## 2024-10-21 DIAGNOSIS — H66002 Acute suppurative otitis media without spontaneous rupture of ear drum, left ear: Secondary | ICD-10-CM

## 2024-10-21 MED ORDER — LEVOFLOXACIN 500 MG PO TABS: 500.0000 mg | ORAL_TABLET | Freq: Every day | ORAL | 0 refills | Status: AC

## 2024-10-21 MED ORDER — CHLORPHEN-PE-ACETAMINOPHEN 4-10-325 MG PO TABS: 1.0000 | ORAL_TABLET | Freq: Four times a day (QID) | ORAL | 0 refills | Status: AC | PRN

## 2024-10-21 NOTE — Progress Notes (Signed)
 Acute Office Visit  Subjective:     Patient ID: [name], female    DOB: [birth year], 53 y.o.   MRN: [id number]  Chief Complaint  Patient presents with   Facial Pain    HPI  History of Present Illness   [name] is a 53 year old female with a history of outdoor allergies who presents with headache and sinus pressure.  She has been experiencing a headache for about a week, described as being located all around her head and extending to the back of her neck. This type of headache is not typical for her. She has been taking ibuprofen for relief but is generally hesitant to take medications due to poor reactions in the past. She has chronic nasal congestion and has had increased sinus pressure for 1 week, worsening. She reports sinus pressure primarily around her cheeks with some swelling in the same area.   For the past three days, she has felt unwell with symptoms including fatigue, body aches, and hoarseness. She experiences drainage and is concerned about it potentially moving to her chest, although she has not noticed any wheezing or shortness of breath. Her temperature was slightly elevated at 99.65F, which is higher than her normal.  She has a history of outdoor allergies, which frequently cause a stuffy or runny nose. She has been using Benadryl  intermittently to manage her symptoms, although allergy  medications tend to dry her eyes, necessitating the use of eye drops. She frequently experiences sinus infections and attributes this to her outdoor allergies, using saline to help manage her symptoms.  She reports that her ears feel clogged but not painful, although there is some tenderness. No wheezing, shortness of breath, or productive cough.       ROS As per HPI.      Objective:    BP 102/63   Pulse 69   Temp 98.6 F (37 C) (Temporal)   Ht 5' 7 (1.702 m)   Wt 171 lb 9.6 oz (77.8 kg)   SpO2 98%   BMI 26.88 kg/m    Physical Exam Vitals and  nursing note reviewed.  Constitutional:      General: She is not in acute distress.    Appearance: She is not ill-appearing, toxic-appearing or diaphoretic.  HENT:     Head: Normocephalic and atraumatic.     Right Ear: Ear canal and external ear normal. A middle ear effusion is present. Tympanic membrane is not erythematous or bulging.     Left Ear: Ear canal and external ear normal. Tympanic membrane is erythematous and bulging.     Nose: Congestion present.     Right Sinus: Maxillary sinus tenderness present. No frontal sinus tenderness.     Left Sinus: Maxillary sinus tenderness present. No frontal sinus tenderness.     Mouth/Throat:     Mouth: Mucous membranes are moist.     Pharynx: Oropharynx is clear.     Tonsils: No tonsillar exudate or tonsillar abscesses. 1+ on the right. 1+ on the left.  Eyes:     General:        Right eye: No discharge.        Left eye: No discharge.     Conjunctiva/sclera: Conjunctivae normal.  Cardiovascular:     Rate and Rhythm: Normal rate and regular rhythm.     Pulses: Normal pulses.     Heart sounds: Normal heart sounds. No murmur heard. Pulmonary:     Effort: Pulmonary effort is normal.  No respiratory distress.     Breath sounds: Normal breath sounds.  Abdominal:     General: Bowel sounds are normal. There is no distension.     Palpations: Abdomen is soft. There is no mass.     Tenderness: There is no abdominal tenderness. There is no guarding or rebound.  Musculoskeletal:     Cervical back: Neck supple. No tenderness.     Right lower leg: No edema.     Left lower leg: No edema.  Lymphadenopathy:     Cervical: Cervical adenopathy present.  Skin:    General: Skin is warm and dry.  Neurological:     General: No focal deficit present.     Mental Status: She is alert and oriented to person, place, and time.  Psychiatric:        Mood and Affect: Mood normal.        Behavior: Behavior normal.     No results found for any visits on  10/21/24.      Assessment & Plan:   [name] was seen today for facial pain.  Diagnoses and all orders for this visit:  Acute non-recurrent maxillary sinusitis -     Chlorphen-PE-Acetaminophen  4-10-325 MG TABS; Take 1 tablet by mouth every 6 (six) hours as needed.  Non-recurrent acute suppurative otitis media of left ear without spontaneous rupture of tympanic membrane -     levofloxacin (LEVAQUIN) 500 MG tablet; Take 1 tablet (500 mg total) by mouth daily for 7 days.   Assessment and Plan    Acute sinusitis with chronic allergic rhinitis Bacterial sinusitis suspected due to duration and severity. Chronic allergic rhinitis exacerbates symptoms. - Prescribed levofloxacin 500 mg once daily for 7 days. - Prescribed Norel AD for decongestion. - Instructed to monitor for worsening symptoms such as shortness of breath or chest pain.  Acute otitis media, left ear  - Treat with levofloxacin as prescribed      Return to office for new or worsening symptoms, or if symptoms persist.   The patient indicates understanding of these issues and agrees with the plan.  Kiara CHRISTELLA Search, FNP

## 2024-10-23 ENCOUNTER — Ambulatory Visit
Admission: EM | Admit: 2024-10-23 | Discharge: 2024-10-23 | Disposition: A | Discharge status: Home / Self Care | Attending: Nurse Practitioner | Admitting: Nurse Practitioner

## 2024-10-23 ENCOUNTER — Encounter: Payer: Self-pay | Admitting: Emergency Medicine

## 2024-10-23 DIAGNOSIS — J309 Allergic rhinitis, unspecified: Secondary | ICD-10-CM | POA: Diagnosis not present

## 2024-10-23 DIAGNOSIS — M542 Cervicalgia: Secondary | ICD-10-CM | POA: Diagnosis not present

## 2024-10-23 DIAGNOSIS — R52 Pain, unspecified: Secondary | ICD-10-CM | POA: Diagnosis not present

## 2024-10-23 LAB — POC SOFIA SARS ANTIGEN FIA: SARS Coronavirus 2 Ag: NEGATIVE

## 2024-10-23 MED ORDER — KETOROLAC TROMETHAMINE 30 MG/ML IJ SOLN
30.0000 mg | Freq: Once | INTRAMUSCULAR | Status: AC
  Administered 2024-10-23: 30 mg via INTRAMUSCULAR

## 2024-10-23 MED ORDER — AZELASTINE HCL 0.1 % NA SOLN: 2.0000 | Freq: Two times a day (BID) | NASAL | 0 refills | Status: AC

## 2024-10-23 MED ORDER — DEXAMETHASONE SOD PHOSPHATE PF 10 MG/ML IJ SOLN
5.0000 mg | Freq: Once | INTRAMUSCULAR | Status: AC
  Administered 2024-10-23: 5 mg via INTRAMUSCULAR
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
## 2024-10-23 NOTE — ED Triage Notes (Signed)
 Diagnosed with ear infection in both ears on Thursday.  Was place on  levaquin.    Now has body pain and states neck is stiff.  States while sitting in the room, she noticed her vision is blurry.

## 2024-10-23 NOTE — Discharge Instructions (Signed)
 You were given injections of Toradol  30 mg and Decadron 5 mg. Continue taking the Levaquin for your ear infection. You may take over-the-counter Tylenol  as needed for breakthrough pain or discomfort. Recommend the use of ice or heat to help with your neck pain or discomfort.  Apply ice for pain or swelling, heat for spasm or stiffness.  Apply for 20 minutes, remove for 1 hour, repeat as needed. Gentle range of motion exercises to help with your neck pain. Go to the emergency department if you experience worsening neck pain, become unable to turn your head or neck, develop trouble swallowing, or other concerns. If your symptoms fail to improve, follow-up with your primary care physician for further evaluation. Follow-up as needed.

## 2024-10-25 ENCOUNTER — Telehealth: Payer: Self-pay

## 2024-10-25 NOTE — Telephone Encounter (Signed)
NTBS.

## 2024-10-25 NOTE — Telephone Encounter (Signed)
 Left message to schedule. LS

## 2024-10-25 NOTE — Telephone Encounter (Signed)
 Pt was seen on 10/23 and was given Levofloxacin. Pt has not completed the course yet but she has been taking the medication. Pt reports trouble moving her neck and neck stiffness with pain. Feeling worse then when she saw us . Please advise.

## 2024-10-26 NOTE — Telephone Encounter (Unsigned)
 Copied from CRM #[id number]. Topic: General - Other >> Oct 26, 2024  2:33 PM Zebedee SAUNDERS wrote: Reason for CRM: Pt has completed her antibiotics for ear infection but still have dizziness and pressure. Pt wants a call back at [phone]

## 2024-10-26 NOTE — Telephone Encounter (Signed)
Pt scheduled    LS

## 2024-10-27 ENCOUNTER — Ambulatory Visit (HOSPITAL_COMMUNITY)

## 2024-10-27 ENCOUNTER — Ambulatory Visit (INDEPENDENT_AMBULATORY_CARE_PROVIDER_SITE_OTHER): Admitting: Nurse Practitioner

## 2024-10-27 ENCOUNTER — Encounter (HOSPITAL_COMMUNITY): Payer: Self-pay

## 2024-10-27 ENCOUNTER — Encounter: Payer: Self-pay | Admitting: Nurse Practitioner

## 2024-10-27 VITALS — BP 117/69 | HR 65 | Temp 98.1°F | Ht 67.0 in | Wt 169.0 lb

## 2024-10-27 DIAGNOSIS — M542 Cervicalgia: Secondary | ICD-10-CM | POA: Diagnosis not present

## 2024-10-27 DIAGNOSIS — J0100 Acute maxillary sinusitis, unspecified: Secondary | ICD-10-CM | POA: Insufficient documentation

## 2024-10-27 MED ORDER — AMOXICILLIN-POT CLAVULANATE 875-125 MG PO TABS
1.0000 | ORAL_TABLET | Freq: Two times a day (BID) | ORAL | 0 refills | Status: AC
Start: 2024-10-27 — End: ?

## 2024-10-27 MED ORDER — NAPROXEN 500 MG PO TABS: 500.0000 mg | ORAL_TABLET | Freq: Two times a day (BID) | ORAL | 0 refills | Status: AC

## 2024-10-27 NOTE — Therapy (Signed)
 [facility] at [facility] [address] [place], [place], [address] Phone: [phone]   Fax:  [phone]  Patient Details  Name: MARGUETTA WINDISH MRN: [id number] Date of Birth: 12/14/1971 Referring Provider:  Dettinger, Fonda LABOR, MD  Encounter Date: 10/27/2024   Pt was taken back for ortho evaluation. It is apparent this pt needs to be seen by the wound/lymph team and was referred to this clinic from other ortho team for such treatment. Pt informed that there is a wait time for lymphedema treatment and that we would call once there is an opening. No evaluation charged this date.   Lang Ada, PT, DPT [facility] Office: [phone] 3:32 PM, 10/27/24   [facility] at [facility] [address] [place], [place], [address] Phone: [phone]   Fax:  [phone]
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
## 2024-10-28 ENCOUNTER — Other Ambulatory Visit: Payer: Self-pay | Admitting: Medical Genetics

## 2024-10-28 DIAGNOSIS — Z006 Encounter for examination for normal comparison and control in clinical research program: Secondary | ICD-10-CM

## 2024-11-22 ENCOUNTER — Ambulatory Visit: Admitting: Nurse Practitioner

## 2024-12-05 ENCOUNTER — Emergency Department (HOSPITAL_BASED_OUTPATIENT_CLINIC_OR_DEPARTMENT_OTHER)
Admission: EM | Admit: 2024-12-05 | Discharge: 2024-12-05 | Disposition: A | Discharge status: Home / Self Care | Attending: Emergency Medicine | Admitting: Emergency Medicine

## 2024-12-05 ENCOUNTER — Emergency Department (HOSPITAL_BASED_OUTPATIENT_CLINIC_OR_DEPARTMENT_OTHER)

## 2024-12-05 ENCOUNTER — Encounter (HOSPITAL_BASED_OUTPATIENT_CLINIC_OR_DEPARTMENT_OTHER): Payer: Self-pay

## 2024-12-05 ENCOUNTER — Other Ambulatory Visit: Payer: Self-pay

## 2024-12-05 DIAGNOSIS — M7989 Other specified soft tissue disorders: Secondary | ICD-10-CM

## 2024-12-05 LAB — BASIC METABOLIC PANEL WITH GFR
Anion gap: 8 (ref 5–15)
BUN: 24 mg/dL — ABNORMAL HIGH (ref 6–20)
CO2: 29 mmol/L (ref 22–32)
Calcium: 9.7 mg/dL (ref 8.9–10.3)
Chloride: 103 mmol/L (ref 98–111)
Creatinine, Ser: 0.6 mg/dL (ref 0.44–1.00)
GFR, Estimated: >60 mL/min (ref >60)
Glucose, Bld: 102 mg/dL — ABNORMAL HIGH (ref 70–99)
Potassium: 4.2 mmol/L (ref 3.5–5.1)
Sodium: 140 mmol/L (ref 135–145)

## 2024-12-05 LAB — CBC WITH DIFFERENTIAL/PLATELET
Abs Immature Granulocytes: 0.01 K/uL (ref 0.00–0.07)
Basophils Absolute: 0 K/uL (ref 0.0–0.1)
Basophils Relative: 0 %
Eosinophils Absolute: 0.1 K/uL (ref 0.0–0.5)
Eosinophils Relative: 2 %
HCT: 38.3 % (ref 36.0–46.0)
Hemoglobin: 12.8 g/dL (ref 12.0–15.0)
Immature Granulocytes: 0 %
Lymphocytes Relative: 40 %
Lymphs Abs: 1.8 K/uL (ref 0.7–4.0)
MCH: 29.4 pg (ref 26.0–34.0)
MCHC: 33.4 g/dL (ref 30.0–36.0)
MCV: 88 fL (ref 80.0–100.0)
Monocytes Absolute: 0.4 K/uL (ref 0.1–1.0)
Monocytes Relative: 8 %
Neutro Abs: 2.3 K/uL (ref 1.7–7.7)
Neutrophils Relative %: 50 %
Platelets: 155 K/uL (ref 150–400)
RBC: 4.35 MIL/uL (ref 3.87–5.11)
RDW: 12.5 % (ref 11.5–15.5)
WBC: 4.6 K/uL (ref 4.0–10.5)
nRBC: 0 % (ref 0.0–0.2)

## 2024-12-05 NOTE — ED Provider Notes (Signed)
 [facility] AT [facility] Provider Note   CSN: [id number] Arrival date & time: 12/05/24  1203     Patient presents with: Leg Pain and Leg Swelling   [name] is a 53 y.o. female.   The history is provided by the patient and medical records. No language interpreter was used.  Leg Pain    53 year old female with history of vascular insufficiency, plantar fasciitis, hypothyroidism, generalized anxiety disorder, presenting with complaint of leg pain.  Patient states that she has a family history of lymphedema and she has been dealing with leg swelling and venous insufficiency for quite a while.  She mention she was seen by a vascular surgeon 3 months ago for her leg swelling and had an ultrasound done at that time.  No evidence of blood clot but after that procedure she has had recurrent pain to both legs right greater than left.  Pain describes an aching sensation primarily to the back of her knee and calf that comes and goes.  She also endorsed occasional chest discomfort as well without any shortness of breath.  She does not endorse any fever or chills no recent injury no numbness no weakness.  She was told to wear compression stocking previously but states it does not seem to help with her leg and cause more pain.  She denies any prior history of PE or DVT.  Prior to Admission medications   Medication Sig Start Date End Date Taking? Authorizing Provider  albuterol  (VENTOLIN  HFA) 108 (90 Base) MCG/ACT inhaler  03/01/19   [provider]  amoxicillin -clavulanate (AUGMENTIN ) 875-125 MG tablet Take 1 tablet by mouth 2 (two) times daily. 10/27/24   [facility] Sebastian Pool, NP  azelastine  (ASTELIN ) 0.1 % nasal spray Place 2 sprays into both nostrils 2 (two) times daily. Use in each nostril as directed 10/23/24   Leath-Warren, Etta PARAS, NP  Chlorphen-PE-Acetaminophen  4-10-325 MG TABS Take 1 tablet by mouth every 6 (six) hours as needed. 10/21/24    Joesph Annabella HERO, FNP  naproxen  (NAPROSYN ) 500 MG tablet Take 1 tablet (500 mg total) by mouth 2 (two) times daily with a meal. 10/27/24   [facility] Sebastian Pool, NP    Allergies: Rocephin [ceftriaxone], Ceftriaxone sodium, and Gabapentin    Review of Systems  All other systems reviewed and are negative.   Updated Vital Signs BP 118/68 (BP Location: Right Arm)   Temp 98 F (36.7 C) (Oral)   Resp 16   SpO2 99%   Physical Exam Vitals and nursing note reviewed.  Constitutional:      General: She is not in acute distress.    Appearance: She is well-developed. She is obese.  HENT:     Head: Atraumatic.  Eyes:     Conjunctiva/sclera: Conjunctivae normal.  Cardiovascular:     Rate and Rhythm: Normal rate and regular rhythm.     Pulses: Normal pulses.     Heart sounds: Normal heart sounds.  Pulmonary:     Effort: Pulmonary effort is normal.  Abdominal:     Palpations: Abdomen is soft.     Tenderness: There is no abdominal tenderness.  Musculoskeletal:        General: Tenderness (Diffuse tenderness to gentle palpation of bilateral legs without focal point tenderness.  Some evidence of lymphedema involving bilateral extremities without any overlying skin changes and no pitting edema.) present.     Cervical back: Neck supple.     Right lower leg: Edema present.  Left lower leg: Edema present.  Skin:    Capillary Refill: Capillary refill takes less than 2 seconds.     Findings: No rash.  Neurological:     Mental Status: She is alert. Mental status is at baseline.  Psychiatric:        Mood and Affect: Mood normal.     (all labs ordered are listed, but only abnormal results are displayed) Labs Reviewed  BASIC METABOLIC PANEL WITH GFR - Abnormal; Notable for the following components:      Result Value   Glucose, Bld 102 (*)    BUN 24 (*)    All other components within normal limits  CBC WITH DIFFERENTIAL/PLATELET    EKG: EKG Interpretation Date/Time:  Sunday  December 05 2024 12:40:28 EST Ventricular Rate:  64 PR Interval:  170 QRS Duration:  94 QT Interval:  402 QTC Calculation: 414 R Axis:   66  Text Interpretation: Normal sinus rhythm Nonspecific ST abnormality Confirmed by Bernard Drivers ([id number]) on 12/05/2024 1:17:28 PM  Radiology: US  Venous Img Lower Unilateral Right Result Date: 12/05/2024 CLINICAL DATA:  Right lower extremity swelling. EXAM: RIGHT LOWER EXTREMITY VENOUS DOPPLER ULTRASOUND TECHNIQUE: Gray-scale sonography with compression, as well as color and duplex ultrasound, were performed to evaluate the deep venous system(s) from the level of the common femoral vein through the popliteal and proximal calf veins. COMPARISON:  None Available. FINDINGS: VENOUS Normal compressibility of the common femoral, superficial femoral, and popliteal veins, as well as the posterior tibial vein. The peroneal vein is not seen due to edema. Visualized portions of profunda femoral vein and great saphenous vein unremarkable. No filling defects to suggest DVT on grayscale or color Doppler imaging. Doppler waveforms show normal direction of venous flow, normal respiratory plasticity and response to augmentation. Limited views of the contralateral common femoral vein are unremarkable. OTHER None. Limitations: Edema. IMPRESSION: No definite evidence of right lower extremity deep venous thrombosis. Peroneal vein is not visualized due to edema. Electronically Signed   By: Leita Birmingham M.D.   On: 12/05/2024 16:04     Procedures   Medications Ordered in the ED - No data to display                                  Medical Decision Making Amount and/or Complexity of Data Reviewed Labs: ordered.   BP 118/68 (BP Location: Right Arm)   Temp 98 F (36.7 C) (Oral)   Resp 16   SpO2 99%   88:33 PM  53 year old female with history of vascular insufficiency, plantar fasciitis, hypothyroidism, generalized anxiety disorder, presenting with complaint of leg pain.   Patient states that she has a family history of lymphedema and she has been dealing with leg swelling and venous insufficiency for quite a while.  She mention she was seen by a vascular surgeon 3 months ago for her leg swelling and had an ultrasound done at that time.  No evidence of blood clot but after that procedure she has had recurrent pain to both legs right greater than left.  Pain describes an aching sensation primarily to the back of her knee and calf that comes and goes.  She also endorsed occasional chest discomfort as well without any shortness of breath.  She does not endorse any fever or chills no recent injury no numbness no weakness.  She was told to wear compression stocking previously but states it does not seem  to help with her leg and cause more pain.  She denies any prior history of PE or DVT.  Exam notable for tenderness diffusely to bilateral extremities right greater than left without focal point tenderness and no overlying skin changes.  Patient does have some lymphedema but no pitting edema.  She is neurovascularly intact.  Will obtain a venous ultrasound to rule out DVT.  -Labs ordered, independently viewed and interpreted by me.  Labs remarkable for reassuring labs -The patient was maintained on a cardiac monitor.  I personally viewed and interpreted the cardiac monitored which showed an underlying rhythm of: sinus bradycardia -Imaging independently viewed and interpreted by me and I agree with radiologist's interpretation.  Result remarkable for vascular US  of RLE without evidence of DVT -This patient presents to the ED for concern of leg pain, this involves an extensive number of treatment options, and is a complaint that carries with it a high risk of complications and morbidity.  The differential diagnosis includes lymphedema, myositis, dvt, chronic venous stasis, cellulitis -Co morbidities that complicate the patient evaluation includes vascular insufficiency -Treatment  includes reassurance -Reevaluation of the patient after these medicines showed that the patient stayed the same -PCP office notes or outside notes reviewed -Escalation to admission/observation considered: patients feels much better, is comfortable with discharge, and will follow up with PCP -Prescription medication considered, patient comfortable with compressive hose -Social Determinant of Health considered which includes stress      Final diagnoses:  Leg swelling    ED Discharge Orders     None          Nivia Colon, PA-C 12/05/24 1649    Bernard Drivers, MD 12/06/24 1226

## 2024-12-05 NOTE — ED Triage Notes (Signed)
 She c/o chronic known venous insufficiency of her lower legs. Here today with c/o pain behind my right knee x 2 days. She is in no distress. Her skin is normal, warm and dry and she is breathing normally. She also mentions occasional, brief sharp pains in my chest x ~ 1 month. She denies any chest pain today.

## 2024-12-05 NOTE — Discharge Instructions (Signed)
 Fortunately no evidence of blood clot in your right leg.  Please continue to wear compressive hose and follow up with your vascular specialist or your primary care provider for further care.

## 2024-12-27 ENCOUNTER — Ambulatory Visit: Admitting: Internal Medicine

## 2024-12-29 ENCOUNTER — Ambulatory Visit: Admitting: Family Medicine

## 2025-02-08 ENCOUNTER — Ambulatory Visit (HOSPITAL_COMMUNITY): Admitting: Physical Therapy

## 2025-02-10 ENCOUNTER — Ambulatory Visit (HOSPITAL_COMMUNITY): Admitting: Physical Therapy

## 2025-02-15 ENCOUNTER — Ambulatory Visit (HOSPITAL_COMMUNITY): Admitting: Physical Therapy

## 2025-02-18 ENCOUNTER — Ambulatory Visit (HOSPITAL_COMMUNITY)

## 2025-02-23 ENCOUNTER — Ambulatory Visit (HOSPITAL_COMMUNITY)

## 2025-02-25 ENCOUNTER — Ambulatory Visit (HOSPITAL_COMMUNITY)

## 2025-03-01 ENCOUNTER — Ambulatory Visit (HOSPITAL_COMMUNITY): Admitting: Physical Therapy

## 2025-03-03 ENCOUNTER — Ambulatory Visit (HOSPITAL_COMMUNITY): Admitting: Physical Therapy

## 2025-03-07 ENCOUNTER — Ambulatory Visit (HOSPITAL_COMMUNITY): Admitting: Physical Therapy

## 2025-03-09 ENCOUNTER — Ambulatory Visit (HOSPITAL_COMMUNITY): Admitting: Physical Therapy

## 2025-03-11 ENCOUNTER — Ambulatory Visit (HOSPITAL_COMMUNITY): Admitting: Physical Therapy

## 2025-03-14 ENCOUNTER — Ambulatory Visit (HOSPITAL_COMMUNITY): Admitting: Physical Therapy

## 2025-03-16 ENCOUNTER — Ambulatory Visit (HOSPITAL_COMMUNITY): Admitting: Physical Therapy

## 2025-03-18 ENCOUNTER — Ambulatory Visit (HOSPITAL_COMMUNITY): Admitting: Physical Therapy

## 2025-03-21 ENCOUNTER — Ambulatory Visit (HOSPITAL_COMMUNITY): Admitting: Physical Therapy

## 2025-03-23 ENCOUNTER — Ambulatory Visit (HOSPITAL_COMMUNITY): Admitting: Physical Therapy

## 2025-03-25 ENCOUNTER — Ambulatory Visit (HOSPITAL_COMMUNITY): Admitting: Physical Therapy

## 2025-03-28 ENCOUNTER — Ambulatory Visit (HOSPITAL_COMMUNITY): Admitting: Physical Therapy

## 2025-03-30 ENCOUNTER — Ambulatory Visit (HOSPITAL_COMMUNITY): Admitting: Physical Therapy

## 2025-04-01 ENCOUNTER — Ambulatory Visit (HOSPITAL_COMMUNITY): Admitting: Physical Therapy

## 2025-07-26 ENCOUNTER — Ambulatory Visit: Payer: Self-pay
